# Patient Record
Sex: Female | Born: 1970
Health system: Southern US, Community
[De-identification: ages and names within clinical notes are randomized; demographics above are authoritative.]

## PROBLEM LIST (undated history)

## (undated) DIAGNOSIS — R87619 Unspecified abnormal cytological findings in specimens from cervix uteri: Secondary | ICD-10-CM

## (undated) DIAGNOSIS — I10 Essential (primary) hypertension: Secondary | ICD-10-CM

## (undated) DIAGNOSIS — Z973 Presence of spectacles and contact lenses: Secondary | ICD-10-CM

## (undated) DIAGNOSIS — D649 Anemia, unspecified: Secondary | ICD-10-CM

## (undated) DIAGNOSIS — Z8719 Personal history of other diseases of the digestive system: Secondary | ICD-10-CM

## (undated) DIAGNOSIS — Z8711 Personal history of peptic ulcer disease: Secondary | ICD-10-CM

## (undated) DIAGNOSIS — A64 Unspecified sexually transmitted disease: Secondary | ICD-10-CM

## (undated) DIAGNOSIS — E119 Type 2 diabetes mellitus without complications: Secondary | ICD-10-CM

## (undated) DIAGNOSIS — T7840XA Allergy, unspecified, initial encounter: Secondary | ICD-10-CM

## (undated) HISTORY — DX: Unspecified abnormal cytological findings in specimens from cervix uteri: R87.619

## (undated) HISTORY — DX: Presence of spectacles and contact lenses: Z97.3

## (undated) HISTORY — DX: Personal history of peptic ulcer disease: Z87.11

## (undated) HISTORY — DX: Allergy, unspecified, initial encounter: T78.40XA

## (undated) HISTORY — DX: Unspecified sexually transmitted disease: A64

## (undated) HISTORY — DX: Type 2 diabetes mellitus without complications: E11.9

## (undated) HISTORY — DX: Anemia, unspecified: D64.9

## (undated) HISTORY — DX: Essential (primary) hypertension: I10

## (undated) HISTORY — PX: CRYOTHERAPY: SHX1416

## (undated) HISTORY — DX: Personal history of other diseases of the digestive system: Z87.19

---

## 1994-07-22 DIAGNOSIS — A64 Unspecified sexually transmitted disease: Secondary | ICD-10-CM

## 1994-07-22 HISTORY — DX: Unspecified sexually transmitted disease: A64

## 1994-07-22 HISTORY — PX: GYNECOLOGIC CRYOSURGERY: SHX857

## 1999-07-06 ENCOUNTER — Other Ambulatory Visit: Admission: RE | Admit: 1999-07-06 | Discharge: 1999-07-06 | Payer: Self-pay | Admitting: Obstetrics and Gynecology

## 1999-12-04 ENCOUNTER — Encounter: Admission: RE | Admit: 1999-12-04 | Discharge: 2000-03-03 | Payer: Self-pay | Admitting: Endocrinology

## 2001-05-14 ENCOUNTER — Ambulatory Visit (HOSPITAL_COMMUNITY): Admission: RE | Admit: 2001-05-14 | Discharge: 2001-05-14 | Payer: Self-pay | Admitting: Obstetrics and Gynecology

## 2001-05-14 ENCOUNTER — Encounter (INDEPENDENT_AMBULATORY_CARE_PROVIDER_SITE_OTHER): Payer: Self-pay

## 2001-09-21 ENCOUNTER — Other Ambulatory Visit: Admission: RE | Admit: 2001-09-21 | Discharge: 2001-09-21 | Payer: Self-pay | Admitting: Obstetrics and Gynecology

## 2002-12-08 ENCOUNTER — Other Ambulatory Visit: Admission: RE | Admit: 2002-12-08 | Discharge: 2002-12-08 | Payer: Self-pay | Admitting: Obstetrics and Gynecology

## 2003-12-30 ENCOUNTER — Other Ambulatory Visit: Admission: RE | Admit: 2003-12-30 | Discharge: 2003-12-30 | Payer: Self-pay | Admitting: Obstetrics and Gynecology

## 2005-11-26 ENCOUNTER — Other Ambulatory Visit: Admission: RE | Admit: 2005-11-26 | Discharge: 2005-11-26 | Payer: Self-pay | Admitting: Obstetrics and Gynecology

## 2007-02-04 ENCOUNTER — Other Ambulatory Visit: Admission: RE | Admit: 2007-02-04 | Discharge: 2007-02-04 | Payer: Self-pay | Admitting: Obstetrics and Gynecology

## 2008-03-23 ENCOUNTER — Other Ambulatory Visit: Admission: RE | Admit: 2008-03-23 | Discharge: 2008-03-23 | Payer: Self-pay | Admitting: Obstetrics and Gynecology

## 2010-12-07 NOTE — Op Note (Signed)
[facility]  Patient:    Tammie Sims, Tammie Sims Visit Number: [id number] MRN: [id number]          Service Type: DSU Location: [facility] Attending Physician:  Miguel Aschoff Dictated by:   Miguel Aschoff, M.D. Proc. Date: 05/14/01 Admit Date:  05/14/2001 Discharge Date: 05/14/2001                             Operative Report  PREOPERATIVE DIAGNOSIS:         Missed abortion.  POSTOPERATIVE DIAGNOSIS:        Missed abortion.  OPERATION:                      Suction curettage.  SURGEON:                        Miguel Aschoff, M.D.  ANESTHESIA:                     IV sedation with paracervical block.  COMPLICATIONS:                  None.  INDICATIONS:                    The patient is a  40 year old black female gravida 3, para 1-0-1-1 last menstrual period of April 04, 2001.  The patient was receiving routine prenatal care when an ultrasound was carried out and revealed the absence of a fetal heart beat and a fetal pole.  This was confirmed by secondary ultrasound.  In view of the diagnosis now of missed abortion, the patient is being taken to the operating room to undergo evacuation of the uterus via dilatation and evacuation.  The risks and benefits of this procedure have been discussed with the patient.  DESCRIPTION OF PROCEDURE:       The patient was taken to the operating room and placed in the supine position.  Intravenous sedation was administered without difficulty.  She was then placed in the dorsal lithotomy position, prepped and draped in the usual sterile fashion.  Examination revealed the uterus to be anterior with normal, anterior, approximately 8-10 weeks in size. The adnexa revealed no masses.  The speculum was placed in the vaginal vault. The anterior cervical lip was grasped with a tenaculum and then injected with 18 cc of 1% Xylocaine by placing 6 cc at the 6, 4 and 8 oclock positions.  As this was done, serial Pratt dilators were used to  dilate the endocervical canal until a #29 Pratt dilator could be passed.  Then using a #9 vacuum curette the contents of the uterus were systemically evacuated.  After this was done, sharp curettage was carried out yielding only a small amount of additional tissue.  Final pass was made with the vacuum curet and at this point, the procedure was completed with no further products of conception were returned.  All instruments were removed.  Excellent hemostasis was achieved, and the procedure was completed.  The patient was reversed from the anesthetic and taken to the recovery room in satisfactory condition.  Estimated blood loss was approximately 80 cc.  PLAN:                           The plan is for the patient to be discharged home.  Medications for home include doxycycline  100 mg twice a day x 3 days and Darvocet-N 100 mg one every 4-6 hours as needed for pain.  She is to call if there are any problems such as fever, pain or heavy bleeding.  She will be seen back in the office in four weeks for follow-up examination.  The patients blood type is noted to be Rh positive. Dictated by:   Miguel Aschoff, M.D. Attending Physician:  Miguel Aschoff DD:  05/14/01 TD:  05/16/01 Job: [id number]

## 2013-01-29 ENCOUNTER — Encounter: Payer: Self-pay | Admitting: Obstetrics and Gynecology

## 2013-02-15 ENCOUNTER — Encounter: Payer: Self-pay | Admitting: Obstetrics and Gynecology

## 2013-02-16 ENCOUNTER — Ambulatory Visit: Payer: Self-pay | Admitting: Obstetrics and Gynecology

## 2013-02-16 ENCOUNTER — Encounter: Payer: Self-pay | Admitting: Obstetrics and Gynecology

## 2013-02-23 ENCOUNTER — Ambulatory Visit: Payer: Self-pay | Admitting: Obstetrics and Gynecology

## 2013-02-23 ENCOUNTER — Telehealth: Payer: Self-pay | Admitting: Obstetrics and Gynecology

## 2013-02-23 NOTE — Telephone Encounter (Signed)
FYI only--patient cancelled aex for today with Dr. Tresa Res due to "uncomfortable being seen on cycle." RS with Dr. Tresa Res for 03/03/13.

## 2013-03-03 ENCOUNTER — Encounter: Payer: Self-pay | Admitting: Obstetrics and Gynecology

## 2013-03-03 ENCOUNTER — Ambulatory Visit (INDEPENDENT_AMBULATORY_CARE_PROVIDER_SITE_OTHER): Payer: 59 | Admitting: Obstetrics and Gynecology

## 2013-03-03 VITALS — BP 124/70 | HR 80 | Ht 67.0 in | Wt 288.5 lb

## 2013-03-03 DIAGNOSIS — N899 Noninflammatory disorder of vagina, unspecified: Secondary | ICD-10-CM

## 2013-03-03 DIAGNOSIS — Z0000 Encounter for general adult medical examination without abnormal findings: Secondary | ICD-10-CM

## 2013-03-03 DIAGNOSIS — Z01419 Encounter for gynecological examination (general) (routine) without abnormal findings: Secondary | ICD-10-CM

## 2013-03-03 LAB — POCT URINALYSIS DIPSTICK
Blood, UA: NEGATIVE
Glucose, UA: NEGATIVE
Nitrite, UA: NEGATIVE
Urobilinogen, UA: NEGATIVE
pH, UA: 5

## 2013-03-03 MED ORDER — NORETHINDRONE 0.35 MG PO TABS: 1.0000 | ORAL_TABLET | Freq: Every day | ORAL | Status: DC

## 2013-03-03 NOTE — Progress Notes (Addendum)
Patient ID: Tammie Sims, female   DOB: Feb 05, 1971, 42 y.o.   MRN: [id number] 42 y.o.   Married    [place]   female   [phone]   here for annual exam.  Husband decided he would NOT do a vasectomy, so she is still on micronor.  Menses somewhat irregular, moderate flow.   Blood sugars are mildly elevated; her PCP is just watching them for now and she is not on any medication for diabetes.  Pt states she was treated in April for a bladder infection with an antibiotic, and she has had some vaginal itching and wonders if she has a yeast infection.   Patient's last menstrual period was 02/14/2013.          Sexually active: yes  The current method of family planning is OCP (estrogen/progesterone).    Exercising: no Last mammogram:  never Last pap smear:02-08-12 wnl:no HR HPV done History of abnormal pap: 20 years ago had colposcopy and cryotherapy to cervix.  Pap smears normal since. Smoking: no Alcohol: no Last colonoscopy: never Last Bone Density:  never Last tetanus shot: 08/2012 Last cholesterol check: 06/2012 wnl  Hgb:                Urine: Neg   Family History  Problem Relation Age of Onset  . Hypertension Mother   . Diabetes Maternal Grandmother   . Hypertension Maternal Grandmother   . Breast cancer Maternal Grandmother   . Diabetes Maternal Grandfather   . Hypertension Maternal Grandfather     There are no active problems to display for this patient.   Past Medical History  Diagnosis Date  . Hypertension   . History of stomach ulcers     age 21 or 75  . Diabetes mellitus without complication     controlled by diet  . STD (sexually transmitted disease) 1996    Hx HSV  . Anemia     Past Surgical History  Procedure Laterality Date  . Cesarean section  1997  . Gynecologic cryosurgery  1996    abnormal pap    Allergies: Review of patient's allergies indicates no known allergies.  Current Outpatient Prescriptions  Medication Sig Dispense Refill  . calcium  carbonate (OS-CAL) 600 MG TABS tablet Take 600 mg by mouth 2 (two) times daily with a meal.      . Cetirizine HCl (ZYRTEC PO) Take by mouth daily.      . Multiple Vitamin (MULTI-VITAMIN DAILY PO) Take by mouth daily.      . norethindrone (MICRONOR,CAMILA,ERRIN) 0.35 MG tablet Take 1 tablet by mouth daily.      Marland Kitchen olmesartan-hydrochlorothiazide (BENICAR HCT) 20-12.5 MG per tablet Take 1 tablet by mouth daily.       No current facility-administered medications for this visit.    ROS: Pertinent items are noted in HPI.  Social Hx:  Married, one son born 1997, works in Clinical biochemist  Exam:    BP 124/70  Pulse 80  Ht 5\' 7"  (1.702 m)  Wt 288 lb 8 oz (130.863 kg)  BMI 45.17 kg/m2  LMP 07/27/2014Ht stable and wt up 4 pounds from last year   Wt Readings from Last 3 Encounters:  03/03/13 288 lb 8 oz (130.863 kg)     Ht Readings from Last 3 Encounters:  03/03/13 5\' 7"  (1.702 m)    General appearance: alert, cooperative and appears stated age Head: Normocephalic, without obvious abnormality, atraumatic Neck: no adenopathy, supple, symmetrical, trachea midline and thyroid  not enlarged, symmetric, no tenderness/mass/nodules Lungs: clear to auscultation bilaterally Breasts: Inspection negative, No nipple retraction or dimpling, No nipple discharge or bleeding, No axillary or supraclavicular adenopathy, Normal to palpation without dominant masses Heart: regular rate and rhythm Abdomen: soft, non-tender; bowel sounds normal; no masses,  no organomegaly Extremities: extremities normal, atraumatic, no cyanosis or edema Skin: Skin color, texture, turgor normal. No rashes or lesions Lymph nodes: Cervical, supraclavicular, and axillary nodes normal. No abnormal inguinal nodes palpated Neurologic: Grossly normal   Pelvic: External genitalia:  no lesions              Urethra:  normal appearing urethra with no masses, tenderness or lesions              Bartholins and Skenes: normal                  Vagina: normal appearing vagina with normal color and discharge, no lesions              Cervix: normal appearance              Pap taken: no        Bimanual Exam:  Uterus:  uterus is normal size, shape, consistency and nontender                                      Adnexa: normal adnexa in size, nontender and no masses                                      Rectovaginal: Confirms                                      Anus:  normal sphincter tone, no lesions  A: normal gyn exam, micronor     HTN, obesity, AODM     HSV vulva dx'd 1996     P:     mammogram counseled on breast self exam, mammography screening, adequate intake of calcium and vitamin D, diet and exercise return annually or prn rf micronor for 1 year   Wet prep neg for yeast, bv, or trich.  Pt reassured.   An After Visit Summary was printed and given to the patient.

## 2013-03-03 NOTE — Patient Instructions (Signed)
EXERCISE AND DIET:  We recommended that you start or continue a regular exercise program for good health. Regular exercise means any activity that makes your heart beat faster and makes you sweat.  We recommend exercising at least 30 minutes per day at least 3 days a week, preferably 4 or 5.  We also recommend a diet low in fat and sugar.  Inactivity, poor dietary choices and obesity can cause diabetes, heart attack, stroke, and kidney damage, among others.    ALCOHOL AND SMOKING:  Women should limit their alcohol intake to no more than 7 drinks/beers/glasses of wine (combined, not each!) per week. Moderation of alcohol intake to this level decreases your risk of breast cancer and liver damage. And of course, no recreational drugs are part of a healthy lifestyle.  And absolutely no smoking or even second hand smoke. Most people know smoking can cause heart and lung diseases, but did you know it also contributes to weakening of your bones? Aging of your skin?  Yellowing of your teeth and nails?  CALCIUM AND VITAMIN D:  Adequate intake of calcium and Vitamin D are recommended.  The recommendations for exact amounts of these supplements seem to change often, but generally speaking 600 mg of calcium (either carbonate or citrate) and 800 units of Vitamin D per day seems prudent. Certain women may benefit from higher intake of Vitamin D.  If you are among these women, your doctor will have told you during your visit.    PAP SMEARS:  Pap smears, to check for cervical cancer or precancers,  have traditionally been done yearly, although recent scientific advances have shown that most women can have pap smears less often.  However, every woman still should have a physical exam from her gynecologist every year. It will include a breast check, inspection of the vulva and vagina to check for abnormal growths or skin changes, a visual exam of the cervix, and then an exam to evaluate the size and shape of the uterus and  ovaries.  And after 40 years of age, a rectal exam is indicated to check for rectal cancers. We will also provide age appropriate advice regarding health maintenance, like when you should have certain vaccines, screening for sexually transmitted diseases, bone density testing, colonoscopy, mammograms, etc.   MAMMOGRAMS:  All women over 40 years old should have a yearly mammogram. Many facilities now offer a "3D" mammogram, which may cost around $50 extra out of pocket. If possible,  we recommend you accept the option to have the 3D mammogram performed.  It both reduces the number of women who will be called back for extra views which then turn out to be normal, and it is better than the routine mammogram at detecting truly abnormal areas.       

## 2013-03-03 NOTE — Addendum Note (Signed)
Addended by: Alison Murray on: 03/03/2013 04:47 PM   Modules accepted: Level of Service

## 2013-03-10 DIAGNOSIS — L249 Irritant contact dermatitis, unspecified cause: Secondary | ICD-10-CM | POA: Insufficient documentation

## 2013-03-10 DIAGNOSIS — L709 Acne, unspecified: Secondary | ICD-10-CM | POA: Insufficient documentation

## 2013-05-28 ENCOUNTER — Other Ambulatory Visit: Payer: Self-pay

## 2013-05-28 DIAGNOSIS — Z1231 Encounter for screening mammogram for malignant neoplasm of breast: Secondary | ICD-10-CM

## 2013-06-02 ENCOUNTER — Encounter: Payer: Self-pay | Admitting: Nurse Practitioner

## 2013-06-29 ENCOUNTER — Ambulatory Visit
Admission: RE | Admit: 2013-06-29 | Discharge: 2013-06-29 | Disposition: A | Discharge status: Home / Self Care | Payer: 59 | Source: Ambulatory Visit

## 2013-06-29 ENCOUNTER — Other Ambulatory Visit: Payer: Self-pay

## 2013-06-29 DIAGNOSIS — Z1231 Encounter for screening mammogram for malignant neoplasm of breast: Secondary | ICD-10-CM

## 2013-07-01 ENCOUNTER — Other Ambulatory Visit: Payer: Self-pay | Admitting: Obstetrics and Gynecology

## 2013-07-01 DIAGNOSIS — R928 Other abnormal and inconclusive findings on diagnostic imaging of breast: Secondary | ICD-10-CM

## 2013-07-07 ENCOUNTER — Ambulatory Visit
Admission: RE | Admit: 2013-07-07 | Discharge: 2013-07-07 | Disposition: A | Discharge status: Home / Self Care | Payer: 59 | Source: Ambulatory Visit | Attending: Obstetrics and Gynecology | Admitting: Obstetrics and Gynecology

## 2013-07-07 DIAGNOSIS — R928 Other abnormal and inconclusive findings on diagnostic imaging of breast: Secondary | ICD-10-CM

## 2014-03-04 ENCOUNTER — Ambulatory Visit: Payer: 59 | Admitting: Nurse Practitioner

## 2014-03-14 ENCOUNTER — Encounter: Payer: Self-pay | Admitting: Nurse Practitioner

## 2014-03-14 ENCOUNTER — Ambulatory Visit: Payer: 59 | Admitting: Nurse Practitioner

## 2014-03-14 ENCOUNTER — Ambulatory Visit (INDEPENDENT_AMBULATORY_CARE_PROVIDER_SITE_OTHER): Payer: 59 | Admitting: Nurse Practitioner

## 2014-03-14 VITALS — BP 120/64 | HR 80 | Ht 66.25 in | Wt 287.0 lb

## 2014-03-14 DIAGNOSIS — Z0000 Encounter for general adult medical examination without abnormal findings: Secondary | ICD-10-CM

## 2014-03-14 DIAGNOSIS — Z01419 Encounter for gynecological examination (general) (routine) without abnormal findings: Secondary | ICD-10-CM

## 2014-03-14 MED ORDER — NORETHINDRONE 0.35 MG PO TABS: 1.0000 | ORAL_TABLET | Freq: Every day | ORAL | Status: DC

## 2014-03-14 NOTE — Progress Notes (Signed)
Patient ID: Tammie Sims, female   DOB: 07-04-1971, 43 y.o.   MRN: [id number] 43 y.o. [id number] Married African American Fe here for annual exam.  Menses for 4-5 days.  Heavier for 2 days.  Some cramps releif with OTC NSAID'S.  Husband did not get a vasecetomy.  She has been having problems with BP regulation and thought that the POP pill would cause the elevation of BP so she stopped the pill.  Now off and still had trouble but now regulated.  Patient's last menstrual period was 03/02/2014.          Sexually active: Yes.    The current method of family planning is coitus interruptus. Exercising: No.  The patient does not participate in regular exercise at present. Smoker:  no  Health Maintenance: Pap:  02/08/11, WNL, no HPV done MMG:  06/29/13, extra view and ultrasound, Bi-Rads 1: negative  TDaP:  08/2012 Labs: Labs by PCP.  Last checked in 01/2014.    reports that she has never smoked. She does not have any smokeless tobacco history on file. She reports that she does not drink alcohol or use illicit drugs.  Past Medical History  Diagnosis Date  . Hypertension   . History of stomach ulcers     age 45 or 42  . Diabetes mellitus without complication     controlled by diet  . STD (sexually transmitted disease) 1996    Hx HSV  . Anemia     Past Surgical History  Procedure Laterality Date  . Cesarean section  1997  . Gynecologic cryosurgery  1996    abnormal pap    Current Outpatient Prescriptions  Medication Sig Dispense Refill  . calcium carbonate (OS-CAL) 600 MG TABS tablet Take 600 mg by mouth 2 (two) times daily with a meal.      . Cetirizine HCl (ZYRTEC PO) Take by mouth daily.      . Multiple Vitamin (MULTI-VITAMIN DAILY PO) Take by mouth daily.      . norethindrone (MICRONOR,CAMILA,ERRIN) 0.35 MG tablet Take 1 tablet (0.35 mg total) by mouth daily.  3 Package  3  . olmesartan-hydrochlorothiazide (BENICAR HCT) 40-12.5 MG per tablet Take 1 tablet by mouth daily.       No  current facility-administered medications for this visit.    Family History  Problem Relation Age of Onset  . Hypertension Mother   . Diabetes Maternal Grandmother   . Hypertension Maternal Grandmother   . Breast cancer Maternal Grandmother   . Diabetes Maternal Grandfather   . Hypertension Maternal Grandfather     ROS:  Pertinent items are noted in HPI.  Otherwise, a comprehensive ROS was negative.  Exam:   BP 120/64  Pulse 80  Ht 5' 6.25" (1.683 m)  Wt 287 lb (130.182 kg)  BMI 45.96 kg/m2  LMP 03/02/2014 Height: 5' 6.25" (168.3 cm)  Ht Readings from Last 3 Encounters:  03/14/14 5' 6.25" (1.683 m)  03/03/13  (1.702 m)    General appearance: alert, cooperative and appears stated age Head: Normocephalic, without obvious abnormality, atraumatic Neck: no adenopathy, supple, symmetrical, trachea midline and thyroid normal to inspection and palpation Lungs: clear to auscultation bilaterally Breasts: normal appearance, no masses or tenderness Heart: regular rate and rhythm Abdomen: soft, non-tender; no masses,  no organomegaly Extremities: extremities normal, atraumatic, no cyanosis or edema Skin: Skin color, texture, turgor normal. No rashes or lesions Lymph nodes: Cervical, supraclavicular, and axillary nodes normal. No abnormal inguinal nodes palpated Neurologic: Grossly  normal   Pelvic: External genitalia:  no lesions              Urethra:  normal appearing urethra with no masses, tenderness or lesions              Bartholin's and Skene's: normal                 Vagina: normal appearing vagina with normal color and discharge, no lesions              Cervix: anteverted              Pap taken: Yes.   Bimanual Exam:  Uterus:  normal size, contour, position, consistency, mobility, non-tender              Adnexa: no mass, fullness, tenderness               Rectovaginal: Confirms               Anus:  normal sphincter tone, no lesions  A:  Well Woman with normal  exam  Withdrawal for contraception  Now restarts POP  History of HTN, borderline DM  P:   Reviewed health and wellness pertinent to exam  Pap smear taken today  Mammogram is due 12/15  Restarted on POP X 1 year - discussed side effects, BUM, and start date.  Counseled on breast self exam, mammography screening, use and side effects of POP's, adequate intake of calcium and vitamin D, diet and exercise return annually or prn  An After Visit Summary was printed and given to the patient.

## 2014-03-14 NOTE — Patient Instructions (Signed)

## 2014-03-17 LAB — IPS PAP TEST WITH HPV

## 2014-03-20 NOTE — Progress Notes (Signed)
Encounter reviewed by Dr. Jasmon Mattice Silva.  

## 2014-05-23 ENCOUNTER — Encounter: Payer: Self-pay | Admitting: Nurse Practitioner

## 2014-05-31 ENCOUNTER — Other Ambulatory Visit: Payer: Self-pay

## 2014-05-31 DIAGNOSIS — Z1231 Encounter for screening mammogram for malignant neoplasm of breast: Secondary | ICD-10-CM

## 2014-06-30 ENCOUNTER — Ambulatory Visit
Admission: RE | Admit: 2014-06-30 | Discharge: 2014-06-30 | Disposition: A | Discharge status: Home / Self Care | Payer: 59 | Source: Ambulatory Visit

## 2014-06-30 DIAGNOSIS — Z1231 Encounter for screening mammogram for malignant neoplasm of breast: Secondary | ICD-10-CM

## 2015-03-16 ENCOUNTER — Ambulatory Visit: Payer: 59 | Admitting: Nurse Practitioner

## 2015-03-17 ENCOUNTER — Ambulatory Visit: Payer: Managed Care, Other (non HMO) | Admitting: Nurse Practitioner

## 2015-05-08 ENCOUNTER — Encounter: Payer: Self-pay | Admitting: Nurse Practitioner

## 2015-05-08 ENCOUNTER — Ambulatory Visit (INDEPENDENT_AMBULATORY_CARE_PROVIDER_SITE_OTHER): Payer: 59 | Admitting: Nurse Practitioner

## 2015-05-08 VITALS — BP 140/60 | HR 64 | Resp 16 | Ht 66.25 in | Wt 284.0 lb

## 2015-05-08 DIAGNOSIS — Z0000 Encounter for general adult medical examination without abnormal findings: Secondary | ICD-10-CM

## 2015-05-08 DIAGNOSIS — Z01419 Encounter for gynecological examination (general) (routine) without abnormal findings: Secondary | ICD-10-CM | POA: Diagnosis not present

## 2015-05-08 NOTE — Patient Instructions (Signed)

## 2015-05-08 NOTE — Progress Notes (Signed)
44 y.o. [id number] Married  African American Fe here for annual exam. Menses regular 21 days.  Flow for 5 days.  2 days heavier flow with PMS. She has not been sleeping well and very stressed over work issues.   Patient's last menstrual period was 04/29/2015.          Sexually active: Yes.    The current method of family planning is none, oc withdrawal  Exercising: No.  not regularly Smoker:  no  Health Maintenance: Pap:  03/14/14 WNL/negative HR HPV MMG:  06/30/14 3D-BiRads 1-negative TDaP:  2014 Labs: PCP   reports that she has never smoked. She has never used smokeless tobacco. She reports that she does not drink alcohol or use illicit drugs.  Past Medical History  Diagnosis Date  . Hypertension   . History of stomach ulcers     age [age] or [age]  . Diabetes mellitus without complication (HCC)     controlled by diet  . STD (sexually transmitted disease) 1996    Hx HSV  . Anemia     Past Surgical History  Procedure Laterality Date  . Cesarean section  1997  . Gynecologic cryosurgery  1996    abnormal pap    Current Outpatient Prescriptions  Medication Sig Dispense Refill  . BENICAR HCT 40-25 MG tablet     . Cetirizine HCl (ZYRTEC PO) Take by mouth daily.    . Multiple Vitamin (MULTI-VITAMIN DAILY PO) Take by mouth daily.    Marland Kitchen. omeprazole (PRILOSEC) 20 MG capsule     . Vitamin D, Ergocalciferol, (DRISDOL) 50000 UNITS CAPS capsule Once weekly     No current facility-administered medications for this visit.    Family History  Problem Relation Age of Onset  . Diabetes Maternal Grandmother   . Hypertension Maternal Grandmother   . Breast cancer Maternal Grandmother   . Diabetes Maternal Grandfather   . Hypertension Maternal Grandfather   . Hypertension Father     ROS:  Pertinent items are noted in HPI.  Otherwise, a comprehensive ROS was negative.  Exam:   BP 140/60 mmHg  Pulse 64  Resp 16  Ht 5' 6.25" (1.683 m)  Wt 284 lb (128.822 kg)  BMI 45.48 kg/m2  LMP  04/29/2015 Height: 5' 6.25" (168.3 cm) Ht Readings from Last 3 Encounters:  05/08/15 5' 6.25" (1.683 m)  03/14/14 5' 6.25" (1.683 m)  03/03/13 5\' 7"  (1.702 m)    General appearance: alert, cooperative and appears stated age Head: Normocephalic, without obvious abnormality, atraumatic Neck: no adenopathy, supple, symmetrical, trachea midline and thyroid normal to inspection and palpation Lungs: clear to auscultation bilaterally Breasts: normal appearance, no masses or tenderness Heart: regular rate and rhythm Abdomen: soft, non-tender; no masses,  no organomegaly Extremities: extremities normal, atraumatic, no cyanosis or edema Skin: Skin color, texture, turgor normal. No rashes or lesions Lymph nodes: Cervical, supraclavicular, and axillary nodes normal. No abnormal inguinal nodes palpated Neurologic: Grossly normal   Pelvic: External genitalia:  no lesions              Urethra:  normal appearing urethra with no masses, tenderness or lesions              Bartholin's and Skene's: normal                 Vagina: normal appearing vagina with normal color and discharge, no lesions              Cervix: anteverted  Pap taken: No. Bimanual Exam:  Uterus:  normal size, contour, position, consistency, mobility, non-tender              Adnexa: no mass, fullness, tenderness               Rectovaginal: Confirms               Anus:  normal sphincter tone, no lesions  Chaperone present: no  A:  Well Woman with normal exam  Withdrawal for contraception Off POP for many months History of HTN, borderline DM  Situational stress  P:   Reviewed health and wellness pertinent to exam  Pap smear as above  Mammogram is due 06/2015  Information on Mirena IUD  Counseled on breast self exam, mammography screening, adequate intake of calcium and vitamin D, diet and exercise return annually or prn  An After Visit Summary was printed and given to the patient.

## 2015-05-09 NOTE — Progress Notes (Signed)
Encounter reviewed by Dr. Shaquil Aldana Amundson C. Silva.  

## 2015-08-16 ENCOUNTER — Other Ambulatory Visit: Payer: Self-pay

## 2015-08-16 DIAGNOSIS — Z1231 Encounter for screening mammogram for malignant neoplasm of breast: Secondary | ICD-10-CM

## 2015-08-31 ENCOUNTER — Ambulatory Visit
Admission: RE | Admit: 2015-08-31 | Discharge: 2015-08-31 | Disposition: A | Discharge status: Home / Self Care | Payer: 59 | Source: Ambulatory Visit

## 2015-08-31 ENCOUNTER — Other Ambulatory Visit: Payer: Self-pay

## 2015-08-31 DIAGNOSIS — Z1231 Encounter for screening mammogram for malignant neoplasm of breast: Secondary | ICD-10-CM

## 2015-09-01 ENCOUNTER — Other Ambulatory Visit: Payer: Self-pay | Admitting: Nurse Practitioner

## 2015-09-01 ENCOUNTER — Other Ambulatory Visit: Payer: Self-pay

## 2015-09-01 DIAGNOSIS — N63 Unspecified lump in unspecified breast: Secondary | ICD-10-CM

## 2015-09-05 ENCOUNTER — Ambulatory Visit
Admission: RE | Admit: 2015-09-05 | Discharge: 2015-09-05 | Disposition: A | Discharge status: Home / Self Care | Payer: 59 | Source: Ambulatory Visit | Attending: Nurse Practitioner | Admitting: Nurse Practitioner

## 2015-09-05 ENCOUNTER — Other Ambulatory Visit: Payer: Self-pay

## 2015-09-05 DIAGNOSIS — N63 Unspecified lump in unspecified breast: Secondary | ICD-10-CM

## 2016-05-10 ENCOUNTER — Ambulatory Visit (INDEPENDENT_AMBULATORY_CARE_PROVIDER_SITE_OTHER): Payer: 59 | Admitting: Nurse Practitioner

## 2016-05-10 ENCOUNTER — Encounter: Payer: Self-pay | Admitting: Nurse Practitioner

## 2016-05-10 VITALS — BP 118/74 | HR 72 | Ht 66.25 in | Wt 288.0 lb

## 2016-05-10 DIAGNOSIS — N920 Excessive and frequent menstruation with regular cycle: Secondary | ICD-10-CM

## 2016-05-10 DIAGNOSIS — Z01419 Encounter for gynecological examination (general) (routine) without abnormal findings: Secondary | ICD-10-CM | POA: Diagnosis not present

## 2016-05-10 DIAGNOSIS — Z0000 Encounter for general adult medical examination without abnormal findings: Secondary | ICD-10-CM

## 2016-05-10 MED ORDER — NORETHINDRONE 0.35 MG PO TABS: 1.0000 | ORAL_TABLET | Freq: Every day | ORAL | 4 refills | Status: DC

## 2016-05-10 NOTE — Progress Notes (Signed)
Patient ID: Tammie HeightKatrina C Sims, female   DOB: 12/01/1970, 45 y.o.   MRN: [id number]  45 y.o. [id number] Married  [place] Fe here for annual exam.  No new health problems.  Menses still regular at 21 days.  Flow for 5 days, still 2 heavy days then light.  No cramps.  Now back on RX Vit D for very low Vit D at PCP.  Patient's last menstrual period was 04/29/2016 (exact date).          Sexually active: Yes.    The current method of family planning is none.    Exercising: No.  The patient does not participate in regular exercise at present. Smoker:  no  Health Maintenance: Pap:  03/14/14, Negative with neg HR HPV MMG:09/05/15 Bilateral Diagnostic with Left Breast Ultrasound; Bi-Rads 2: Benign Findings, routine screen in one year TDaP: 08/2012 HIV: 1997 with pregnancy Labs: PCP takes care of all labs   reports that she has never smoked. She has never used smokeless tobacco. She reports that she does not drink alcohol or use drugs.  Past Medical History:  Diagnosis Date  . Anemia   . Diabetes mellitus without complication (HCC)    controlled by diet  . History of stomach ulcers    age 45 or [age]  . Hypertension   . STD (sexually transmitted disease) 1996   Hx HSV    Past Surgical History:  Procedure Laterality Date  . CESAREAN SECTION  1997  . GYNECOLOGIC CRYOSURGERY  1996   abnormal pap    Current Outpatient Prescriptions  Medication Sig Dispense Refill  . BENICAR HCT 40-25 MG tablet     . Cetirizine HCl (ZYRTEC PO) Take by mouth daily.    . Multiple Vitamin (MULTI-VITAMIN DAILY PO) Take by mouth daily.    Marland Kitchen. omeprazole (PRILOSEC) 20 MG capsule     . Vitamin D, Ergocalciferol, (DRISDOL) 50000 UNITS CAPS capsule Once weekly     No current facility-administered medications for this visit.     Family History  Problem Relation Age of Onset  . Hypertension Father   . Diabetes Maternal Grandmother   . Hypertension Maternal Grandmother   . Breast cancer Maternal Grandmother    . Diabetes Maternal Grandfather   . Hypertension Maternal Grandfather     ROS:  Pertinent items are noted in HPI.  Otherwise, a comprehensive ROS was negative.  Exam:   BP 118/74 (BP Location: Right Arm, Patient Position: Sitting, Cuff Size: Large)   Pulse 72   Ht 5' 6.25" (1.683 m)   Wt 288 lb (130.6 kg)   LMP 04/29/2016 (Exact Date)   BMI 46.13 kg/m  Sims: 5' 6.25" (168.3 cm) Ht Readings from Last 3 Encounters:  05/10/16 5' 6.25" (1.683 m)  05/08/15 5' 6.25" (1.683 m)  03/14/14 5' 6.25" (1.683 m)    General appearance: alert, cooperative and appears stated age Head: Normocephalic, without obvious abnormality, atraumatic Neck: no adenopathy, supple, symmetrical, trachea midline and thyroid normal to inspection and palpation Lungs: clear to auscultation bilaterally Breasts: normal appearance, no masses or tenderness Heart: regular rate and rhythm Abdomen: soft, non-tender; no masses,  no organomegaly Extremities: extremities normal, atraumatic, no cyanosis or edema Skin: Skin color, texture, turgor normal. No rashes or lesions Lymph nodes: Cervical, supraclavicular, and axillary nodes normal. No abnormal inguinal nodes palpated Neurologic: Grossly normal   Pelvic: External genitalia:  no lesions              Urethra:  normal appearing urethra  with no masses, tenderness or lesions              Bartholin's and Skene's: normal                 Vagina: normal appearing vagina with normal color and discharge, no lesions              Cervix: anteverted              Pap taken: No. Bimanual Exam:  Uterus:  normal size, contour, position, consistency, mobility, non-tender              Adnexa: no mass, fullness, tenderness               Rectovaginal: Confirms               Anus:  normal sphincter tone, no lesions  Chaperone present: yes  A:  Well Woman with normal exam  Withdrawal for contraception Off POP for many months but now willing to go back on to help  with contraception History of HTN, borderline DM             Situational stress  History of menorrhagia  P:   Reviewed health and wellness pertinent to exam  Pap smear not done  Mammogram is due 2/18  Restart POP for a year - to call back if any problems  She is advised to use BUM and to be compliant to POP  Counseled on breast self exam, mammography screening, adequate intake of calcium and vitamin D, diet and exercise return annually or prn  An After Visit Summary was printed and given to the patient.

## 2016-05-10 NOTE — Patient Instructions (Signed)

## 2016-05-12 NOTE — Progress Notes (Signed)
Encounter reviewed by Dr. Davari Lopes Amundson C. Silva.  

## 2016-08-14 DIAGNOSIS — J3089 Other allergic rhinitis: Secondary | ICD-10-CM | POA: Diagnosis not present

## 2016-08-14 DIAGNOSIS — J301 Allergic rhinitis due to pollen: Secondary | ICD-10-CM | POA: Diagnosis not present

## 2016-08-14 DIAGNOSIS — K219 Gastro-esophageal reflux disease without esophagitis: Secondary | ICD-10-CM | POA: Diagnosis not present

## 2016-08-15 DIAGNOSIS — I10 Essential (primary) hypertension: Secondary | ICD-10-CM | POA: Diagnosis not present

## 2016-08-15 DIAGNOSIS — R7303 Prediabetes: Secondary | ICD-10-CM | POA: Diagnosis not present

## 2016-08-15 DIAGNOSIS — E559 Vitamin D deficiency, unspecified: Secondary | ICD-10-CM | POA: Diagnosis not present

## 2016-08-30 DIAGNOSIS — J329 Chronic sinusitis, unspecified: Secondary | ICD-10-CM | POA: Diagnosis not present

## 2016-08-30 DIAGNOSIS — J309 Allergic rhinitis, unspecified: Secondary | ICD-10-CM | POA: Diagnosis not present

## 2016-10-30 DIAGNOSIS — R7303 Prediabetes: Secondary | ICD-10-CM | POA: Diagnosis not present

## 2016-10-30 DIAGNOSIS — J309 Allergic rhinitis, unspecified: Secondary | ICD-10-CM | POA: Diagnosis not present

## 2016-10-30 DIAGNOSIS — Z0000 Encounter for general adult medical examination without abnormal findings: Secondary | ICD-10-CM | POA: Diagnosis not present

## 2016-10-30 DIAGNOSIS — I10 Essential (primary) hypertension: Secondary | ICD-10-CM | POA: Diagnosis not present

## 2017-02-10 ENCOUNTER — Telehealth: Payer: Self-pay | Admitting: Obstetrics and Gynecology

## 2017-02-10 NOTE — Telephone Encounter (Signed)
Left message regarding upcoming appointment has been canceled and needs to be rescheduled. °

## 2017-05-01 DIAGNOSIS — R7303 Prediabetes: Secondary | ICD-10-CM | POA: Diagnosis not present

## 2017-05-01 DIAGNOSIS — D509 Iron deficiency anemia, unspecified: Secondary | ICD-10-CM | POA: Diagnosis not present

## 2017-05-01 DIAGNOSIS — E7800 Pure hypercholesterolemia, unspecified: Secondary | ICD-10-CM | POA: Diagnosis not present

## 2017-05-01 DIAGNOSIS — I10 Essential (primary) hypertension: Secondary | ICD-10-CM | POA: Diagnosis not present

## 2017-05-01 DIAGNOSIS — J301 Allergic rhinitis due to pollen: Secondary | ICD-10-CM | POA: Diagnosis not present

## 2017-05-01 DIAGNOSIS — E559 Vitamin D deficiency, unspecified: Secondary | ICD-10-CM | POA: Diagnosis not present

## 2017-05-15 NOTE — Progress Notes (Signed)
46 y.o. [id number] Married  African American Fe here for annual exam. Periods normal, no issues. Sees PCP for aex, labs and Type 2 Diabetes management. BP has also come down since her last visit there. Working on her plan of action. She is feeling better about her health issues. Keeping up with dental and eye exams. Contraception withdrawal and ovulation patterns followed. No other health issues today.  Patient's last menstrual period was 04/02/2017.          Sexually active: Yes.    The current method of family planning is none.    Exercising: No.  exercise Smoker:  no  Health Maintenance: Pap:  03-14-14 neg HPV HR neg History of Abnormal Pap: yes in 20's MMG:  09-13-15 left breast u/scategory b density birads 2:neg plans to schedule Self Breast exams: no Colonoscopy:  none BMD:   none TDaP:  2014 Shingles: no Pneumonia: no Hep C and HIV: HIV done with pregnancy Labs: no   reports that she has never smoked. She has never used smokeless tobacco. She reports that she does not drink alcohol or use drugs.  Past Medical History:  Diagnosis Date  . Anemia   . Diabetes mellitus without complication (HCC)    controlled by diet  . History of stomach ulcers    age 46 or [age]  . Hypertension   . STD (sexually transmitted disease) 1996   Hx HSV    Past Surgical History:  Procedure Laterality Date  . CESAREAN SECTION  1997  . GYNECOLOGIC CRYOSURGERY  1996   abnormal pap    Current Outpatient Prescriptions  Medication Sig Dispense Refill  . BENICAR HCT 40-25 MG tablet     . Cetirizine HCl (ZYRTEC PO) Take by mouth daily.    . Cholecalciferol (VITAMIN D PO) Take by mouth.    . Coenzyme Q10 (CO Q 10 PO) Take by mouth.    . Omega-3 Fatty Acids (OMEGA 3 PO) Take by mouth.    Marland Kitchen. omeprazole (PRILOSEC) 20 MG capsule     . Probiotic Product (PROBIOTIC PO) Take by mouth.    . Vitamin D, Ergocalciferol, (DRISDOL) 50000 UNITS CAPS capsule Once weekly     No current facility-administered  medications for this visit.     Family History  Problem Relation Age of Onset  . Hypertension Father   . Diabetes Maternal Grandmother   . Hypertension Maternal Grandmother   . Breast cancer Maternal Grandmother [id number]       mastecomy with recurence to brain at 7672  . Diabetes Maternal Grandfather   . Hypertension Maternal Grandfather     ROS:  Pertinent items are noted in HPI.  Otherwise, a comprehensive ROS was negative.  Exam:   BP 120/70   Pulse 70   Resp 16   Ht 5' 6.25" (1.683 m)   Wt 296 lb (134.3 kg)   LMP 04/02/2017   BMI 47.42 kg/m  Height: 5' 6.25" (168.3 cm) Ht Readings from Last 3 Encounters:  05/16/17 5' 6.25" (1.683 m)  05/10/16 5' 6.25" (1.683 m)  05/08/15 5' 6.25" (1.683 m)    General appearance: alert, cooperative and appears stated age Head: Normocephalic, without obvious abnormality, atraumatic Neck: no adenopathy, supple, symmetrical, trachea midline and thyroid normal to inspection and palpation Lungs: clear to auscultation bilaterally Breasts: normal appearance, no masses or tenderness, No nipple retraction or dimpling, No nipple discharge or bleeding, No axillary or supraclavicular adenopathy, given SBE card reminder Heart: regular rate and rhythm Abdomen: soft, non-tender;  no masses,  no organomegaly Extremities: extremities normal, atraumatic, no cyanosis or edema Skin: Skin color, texture, turgor normal. No rashes or lesions Lymph nodes: Cervical, supraclavicular, and axillary nodes normal. No abnormal inguinal nodes palpated Neurologic: Grossly normal   Pelvic: External genitalia:  no lesions              Urethra:  normal appearing urethra with no masses, tenderness or lesions              Bartholin's and Skene's: normal                 Vagina: normal appearing vagina with normal color and discharge, no lesions              Cervix: anteverted, no bleeding following Pap, no cervical motion tenderness and no lesions              Pap taken: Yes.    Bimanual Exam:  Uterus:  normal size, contour, position, consistency, mobility, non-tender              Adnexa: normal adnexa and no mass, fullness, tenderness               Rectovaginal: Confirms               Anus:  normal sphincter tone, no lesions  Chaperone present: yes  A:  Well Woman with normal exam  Contraception NFP with withdrawal  Type 2 diabetes/hypertension management with PCP  Obese working on weight loss  Mammogram due  P:   Reviewed health and wellness pertinent to exam  Discussed importance of working on weight loss and health issues management. Stressed importance of follow up with PCP  Continue weight loss journey and exercise to her routine, which will help with calorie burn  Patient plans to schedule today  Pap smear: yes   counseled on breast self exam, mammography screening, adequate intake of calcium and vitamin D, diet and exercise  return annually or prn  An After Visit Summary was printed and given to the patient.

## 2017-05-16 ENCOUNTER — Other Ambulatory Visit (HOSPITAL_COMMUNITY)
Admission: RE | Admit: 2017-05-16 | Discharge: 2017-05-16 | Disposition: A | Discharge status: Home / Self Care | Payer: 59 | Source: Ambulatory Visit | Attending: Obstetrics & Gynecology | Admitting: Obstetrics & Gynecology

## 2017-05-16 ENCOUNTER — Encounter: Payer: Self-pay | Admitting: Certified Nurse Midwife

## 2017-05-16 ENCOUNTER — Ambulatory Visit (INDEPENDENT_AMBULATORY_CARE_PROVIDER_SITE_OTHER): Payer: 59 | Admitting: Certified Nurse Midwife

## 2017-05-16 ENCOUNTER — Ambulatory Visit: Payer: 59 | Admitting: Nurse Practitioner

## 2017-05-16 VITALS — BP 120/70 | HR 70 | Resp 16 | Ht 66.25 in | Wt 296.0 lb

## 2017-05-16 DIAGNOSIS — Z124 Encounter for screening for malignant neoplasm of cervix: Secondary | ICD-10-CM

## 2017-05-16 DIAGNOSIS — Z01419 Encounter for gynecological examination (general) (routine) without abnormal findings: Secondary | ICD-10-CM

## 2017-05-16 DIAGNOSIS — N912 Amenorrhea, unspecified: Secondary | ICD-10-CM | POA: Diagnosis not present

## 2017-05-16 DIAGNOSIS — Z0000 Encounter for general adult medical examination without abnormal findings: Secondary | ICD-10-CM

## 2017-05-16 LAB — POCT URINALYSIS DIPSTICK
Bilirubin, UA: NEGATIVE
Glucose, UA: NEGATIVE
Ketones, UA: NEGATIVE
Leukocytes, UA: NEGATIVE
NITRITE UA: NEGATIVE
Protein, UA: NEGATIVE
RBC UA: NEGATIVE
UROBILINOGEN UA: NEGATIVE U/dL — AB
pH, UA: 5 (ref 5.0–8.0)

## 2017-05-16 LAB — POCT URINE PREGNANCY: Preg Test, Ur: NEGATIVE

## 2017-05-16 NOTE — Patient Instructions (Signed)
EXERCISE AND DIET:  We recommended that you start or continue a regular exercise program for good health. Regular exercise means any activity that makes your heart beat faster and makes you sweat.  We recommend exercising at least 30 minutes per day at least 3 days a week, preferably 4 or 5.  We also recommend a diet low in fat and sugar.  Inactivity, poor dietary choices and obesity can cause diabetes, heart attack, stroke, and kidney damage, among others.    ALCOHOL AND SMOKING:  Women should limit their alcohol intake to no more than 7 drinks/beers/glasses of wine (combined, not each!) per week. Moderation of alcohol intake to this level decreases your risk of breast cancer and liver damage. And of course, no recreational drugs are part of a healthy lifestyle.  And absolutely no smoking or even second hand smoke. Most people know smoking can cause heart and lung diseases, but did you know it also contributes to weakening of your bones? Aging of your skin?  Yellowing of your teeth and nails?  CALCIUM AND VITAMIN D:  Adequate intake of calcium and Vitamin D are recommended.  The recommendations for exact amounts of these supplements seem to change often, but generally speaking 600 mg of calcium (either carbonate or citrate) and 800 units of Vitamin D per day seems prudent. Certain women may benefit from higher intake of Vitamin D.  If you are among these women, your doctor will have told you during your visit.    PAP SMEARS:  Pap smears, to check for cervical cancer or precancers,  have traditionally been done yearly, although recent scientific advances have shown that most women can have pap smears less often.  However, every woman still should have a physical exam from her gynecologist every year. It will include a breast check, inspection of the vulva and vagina to check for abnormal growths or skin changes, a visual exam of the cervix, and then an exam to evaluate the size and shape of the uterus and  ovaries.  And after 44 years of age, a rectal exam is indicated to check for rectal cancers. We will also provide age appropriate advice regarding health maintenance, like when you should have certain vaccines, screening for sexually transmitted diseases, bone density testing, colonoscopy, mammograms, etc.   MAMMOGRAMS:  All women over 40 years old should have a yearly mammogram. Many facilities now offer a "3D" mammogram, which may cost around $50 extra out of pocket. If possible,  we recommend you accept the option to have the 3D mammogram performed.  It both reduces the number of women who will be called back for extra views which then turn out to be normal, and it is better than the routine mammogram at detecting truly abnormal areas.    COLONOSCOPY:  Colonoscopy to screen for colon cancer is recommended for all women at age 50.  We know, you hate the idea of the prep.  We agree, BUT, having colon cancer and not knowing it is worse!!  Colon cancer so often starts as a polyp that can be seen and removed at colonscopy, which can quite literally save your life!  And if your first colonoscopy is normal and you have no family history of colon cancer, most women don't have to have it again for 10 years.  Once every ten years, you can do something that may end up saving your life, right?  We will be happy to help you get it scheduled when you are ready.    Be sure to check your insurance coverage so you understand how much it will cost.  It may be covered as a preventative service at no cost, but you should check your particular policy.      Calorie Counting for Weight Loss Calories are units of energy. Your body needs a certain amount of calories from food to keep you going throughout the day. When you eat more calories than your body needs, your body stores the extra calories as fat. When you eat fewer calories than your body needs, your body burns fat to get the energy it needs. Calorie counting means  keeping track of how many calories you eat and drink each day. Calorie counting can be helpful if you need to lose weight. If you make sure to eat fewer calories than your body needs, you should lose weight. Ask your health care provider what a healthy weight is for you. For calorie counting to work, you will need to eat the right number of calories in a day in order to lose a healthy amount of weight per week. A dietitian can help you determine how many calories you need in a day and will give you suggestions on how to reach your calorie goal.  A healthy amount of weight to lose per week is usually 1-2 lb (0.5-0.9 kg). This usually means that your daily calorie intake should be reduced by 500-750 calories.  Eating 1,200 - 1,500 calories per day can help most women lose weight.  Eating 1,500 - 1,800 calories per day can help most men lose weight.  What is my plan? My goal is to have __________ calories per day. If I have this many calories per day, I should lose around __________ pounds per week. What do I need to know about calorie counting? In order to meet your daily calorie goal, you will need to:  Find out how many calories are in each food you would like to eat. Try to do this before you eat.  Decide how much of the food you plan to eat.  Write down what you ate and how many calories it had. Doing this is called keeping a food log.  To successfully lose weight, it is important to balance calorie counting with a healthy lifestyle that includes regular activity. Aim for 150 minutes of moderate exercise (such as walking) or 75 minutes of vigorous exercise (such as running) each week. Where do I find calorie information?  The number of calories in a food can be found on a Nutrition Facts label. If a food does not have a Nutrition Facts label, try to look up the calories online or ask your dietitian for help. Remember that calories are listed per serving. If you choose to have more than one  serving of a food, you will have to multiply the calories per serving by the amount of servings you plan to eat. For example, the label on a package of bread might say that a serving size is 1 slice and that there are 90 calories in a serving. If you eat 1 slice, you will have eaten 90 calories. If you eat 2 slices, you will have eaten 180 calories. How do I keep a food log? Immediately after each meal, record the following information in your food log:  What you ate. Don't forget to include toppings, sauces, and other extras on the food.  How much you ate. This can be measured in cups, ounces, or number of items.  How   many calories each food and drink had.  The total number of calories in the meal.  Keep your food log near you, such as in a small notebook in your pocket, or use a mobile app or website. Some programs will calculate calories for you and show you how many calories you have left for the day to meet your goal. What are some calorie counting tips?  Use your calories on foods and drinks that will fill you up and not leave you hungry: ? Some examples of foods that fill you up are nuts and nut butters, vegetables, lean proteins, and high-fiber foods like whole grains. High-fiber foods are foods with more than 5 g fiber per serving. ? Drinks such as sodas, specialty coffee drinks, alcohol, and juices have a lot of calories, yet do not fill you up.  Eat nutritious foods and avoid empty calories. Empty calories are calories you get from foods or beverages that do not have many vitamins or protein, such as candy, sweets, and soda. It is better to have a nutritious high-calorie food (such as an avocado) than a food with few nutrients (such as a bag of chips).  Know how many calories are in the foods you eat most often. This will help you calculate calorie counts faster.  Pay attention to calories in drinks. Low-calorie drinks include water and unsweetened drinks.  Pay attention to  nutrition labels for "low fat" or "fat free" foods. These foods sometimes have the same amount of calories or more calories than the full fat versions. They also often have added sugar, starch, or salt, to make up for flavor that was removed with the fat.  Find a way of tracking calories that works for you. Get creative. Try different apps or programs if writing down calories does not work for you. What are some portion control tips?  Know how many calories are in a serving. This will help you know how many servings of a certain food you can have.  Use a measuring cup to measure serving sizes. You could also try weighing out portions on a kitchen scale. With time, you will be able to estimate serving sizes for some foods.  Take some time to put servings of different foods on your favorite plates, bowls, and cups so you know what a serving looks like.  Try not to eat straight from a bag or box. Doing this can lead to overeating. Put the amount you would like to eat in a cup or on a plate to make sure you are eating the right portion.  Use smaller plates, glasses, and bowls to prevent overeating.  Try not to multitask (for example, watch TV or use your computer) while eating. If it is time to eat, sit down at a table and enjoy your food. This will help you to know when you are full. It will also help you to be aware of what you are eating and how much you are eating. What are tips for following this plan? Reading food labels  Check the calorie count compared to the serving size. The serving size may be smaller than what you are used to eating.  Check the source of the calories. Make sure the food you are eating is high in vitamins and protein and low in saturated and trans fats. Shopping  Read nutrition labels while you shop. This will help you make healthy decisions before you decide to purchase your food.  Make a grocery list and stick   to it. Cooking  Try to cook your favorite foods in a  healthier way. For example, try baking instead of frying.  Use low-fat dairy products. Meal planning  Use more fruits and vegetables. Half of your plate should be fruits and vegetables.  Include lean proteins like poultry and fish. How do I count calories when eating out?  Ask for smaller portion sizes.  Consider sharing an entree and sides instead of getting your own entree.  If you get your own entree, eat only half. Ask for a box at the beginning of your meal and put the rest of your entree in it so you are not tempted to eat it.  If calories are listed on the menu, choose the lower calorie options.  Choose dishes that include vegetables, fruits, whole grains, low-fat dairy products, and lean protein.  Choose items that are boiled, broiled, grilled, or steamed. Stay away from items that are buttered, battered, fried, or served with cream sauce. Items labeled "crispy" are usually fried, unless stated otherwise.  Choose water, low-fat milk, unsweetened iced tea, or other drinks without added sugar. If you want an alcoholic beverage, choose a lower calorie option such as a glass of wine or light beer.  Ask for dressings, sauces, and syrups on the side. These are usually high in calories, so you should limit the amount you eat.  If you want a salad, choose a garden salad and ask for grilled meats. Avoid extra toppings like bacon, cheese, or fried items. Ask for the dressing on the side, or ask for olive oil and vinegar or lemon to use as dressing.  Estimate how many servings of a food you are given. For example, a serving of cooked rice is  cup or about the size of half a baseball. Knowing serving sizes will help you be aware of how much food you are eating at restaurants. The list below tells you how big or small some common portion sizes are based on everyday objects: ? 1 oz-4 stacked dice. ? 3 oz-1 deck of cards. ? 1 tsp-1 die. ? 1 Tbsp- a ping-pong ball. ? 2 Tbsp-1 ping-pong  ball. ?  cup- baseball. ? 1 cup-1 baseball. Summary  Calorie counting means keeping track of how many calories you eat and drink each day. If you eat fewer calories than your body needs, you should lose weight.  A healthy amount of weight to lose per week is usually 1-2 lb (0.5-0.9 kg). This usually means reducing your daily calorie intake by 500-750 calories.  The number of calories in a food can be found on a Nutrition Facts label. If a food does not have a Nutrition Facts label, try to look up the calories online or ask your dietitian for help.  Use your calories on foods and drinks that will fill you up, and not on foods and drinks that will leave you hungry.  Use smaller plates, glasses, and bowls to prevent overeating. This information is not intended to replace advice given to you by your health care provider. Make sure you discuss any questions you have with your health care provider. Document Released: 07/08/2005 Document Revised: 06/07/2016 Document Reviewed: 06/07/2016 Elsevier Interactive Patient Education  2017 [employer]  

## 2017-05-20 LAB — CYTOLOGY - PAP
Adequacy: ABSENT
Diagnosis: NEGATIVE

## 2017-09-17 DIAGNOSIS — J301 Allergic rhinitis due to pollen: Secondary | ICD-10-CM | POA: Diagnosis not present

## 2017-09-17 DIAGNOSIS — J3089 Other allergic rhinitis: Secondary | ICD-10-CM | POA: Diagnosis not present

## 2017-09-17 DIAGNOSIS — J0100 Acute maxillary sinusitis, unspecified: Secondary | ICD-10-CM | POA: Diagnosis not present

## 2017-09-17 DIAGNOSIS — K219 Gastro-esophageal reflux disease without esophagitis: Secondary | ICD-10-CM | POA: Diagnosis not present

## 2017-10-31 ENCOUNTER — Other Ambulatory Visit: Payer: Self-pay | Admitting: Nurse Practitioner

## 2017-10-31 DIAGNOSIS — Z139 Encounter for screening, unspecified: Secondary | ICD-10-CM

## 2017-11-12 DIAGNOSIS — I10 Essential (primary) hypertension: Secondary | ICD-10-CM | POA: Diagnosis not present

## 2017-11-12 DIAGNOSIS — E559 Vitamin D deficiency, unspecified: Secondary | ICD-10-CM | POA: Diagnosis not present

## 2017-11-12 DIAGNOSIS — E7800 Pure hypercholesterolemia, unspecified: Secondary | ICD-10-CM | POA: Diagnosis not present

## 2017-11-12 DIAGNOSIS — D509 Iron deficiency anemia, unspecified: Secondary | ICD-10-CM | POA: Diagnosis not present

## 2017-11-12 DIAGNOSIS — R7303 Prediabetes: Secondary | ICD-10-CM | POA: Diagnosis not present

## 2017-11-21 ENCOUNTER — Other Ambulatory Visit: Payer: Self-pay | Admitting: Family Medicine

## 2017-11-21 ENCOUNTER — Ambulatory Visit
Admission: RE | Admit: 2017-11-21 | Discharge: 2017-11-21 | Disposition: A | Discharge status: Home / Self Care | Payer: 59 | Source: Ambulatory Visit | Attending: Nurse Practitioner | Admitting: Nurse Practitioner

## 2017-11-21 DIAGNOSIS — Z1231 Encounter for screening mammogram for malignant neoplasm of breast: Secondary | ICD-10-CM | POA: Diagnosis not present

## 2017-11-21 DIAGNOSIS — Z139 Encounter for screening, unspecified: Secondary | ICD-10-CM

## 2018-05-14 DIAGNOSIS — E559 Vitamin D deficiency, unspecified: Secondary | ICD-10-CM | POA: Diagnosis not present

## 2018-05-14 DIAGNOSIS — I10 Essential (primary) hypertension: Secondary | ICD-10-CM | POA: Diagnosis not present

## 2018-05-14 DIAGNOSIS — R7303 Prediabetes: Secondary | ICD-10-CM | POA: Diagnosis not present

## 2018-05-14 DIAGNOSIS — E7800 Pure hypercholesterolemia, unspecified: Secondary | ICD-10-CM | POA: Diagnosis not present

## 2018-05-21 ENCOUNTER — Ambulatory Visit: Payer: 59 | Admitting: Certified Nurse Midwife

## 2018-06-16 ENCOUNTER — Encounter: Payer: Self-pay | Admitting: Certified Nurse Midwife

## 2018-06-16 ENCOUNTER — Other Ambulatory Visit: Payer: Self-pay

## 2018-06-16 ENCOUNTER — Ambulatory Visit (INDEPENDENT_AMBULATORY_CARE_PROVIDER_SITE_OTHER): Payer: 59 | Admitting: Certified Nurse Midwife

## 2018-06-16 VITALS — BP 124/64 | HR 70 | Resp 16 | Ht 65.5 in | Wt 300.0 lb

## 2018-06-16 DIAGNOSIS — Z8679 Personal history of other diseases of the circulatory system: Secondary | ICD-10-CM | POA: Diagnosis not present

## 2018-06-16 DIAGNOSIS — Z01419 Encounter for gynecological examination (general) (routine) without abnormal findings: Secondary | ICD-10-CM | POA: Diagnosis not present

## 2018-06-16 DIAGNOSIS — E663 Overweight: Secondary | ICD-10-CM | POA: Diagnosis not present

## 2018-06-16 DIAGNOSIS — R635 Abnormal weight gain: Secondary | ICD-10-CM

## 2018-06-16 NOTE — Progress Notes (Signed)
47 y.o. [id number] Married  African American Fe here for annual exam. Patient premenstrual and having irritability each month prior to onset of periods. Recent labs and aex with PCP all normal except for Hgb A1-C. Hypertension stable on medication. She is now working on diet for weight loss and glucose control. Aware she can improve this with keeping weight under control. No other issues today.  Patient's last menstrual period was 05/24/2018 (exact date).          Sexually active: Yes.    The current method of family planning is rhythm method.    Exercising: No.  exercise Smoker:  no  Review of Systems  Constitutional: Negative.   HENT: Negative.   Eyes: Negative.   Respiratory: Negative.   Cardiovascular: Negative.   Gastrointestinal: Negative.   Genitourinary: Negative.   Musculoskeletal: Negative.   Skin: Negative.   Neurological: Negative.   Endo/Heme/Allergies: Negative.   Psychiatric/Behavioral: Negative.     Health Maintenance: Pap:  03-14-14 neg HPV HR neg, 05-16-17 neg History of Abnormal Pap: cryo MMG:  11-21-17 category b density birads 1:neg Self Breast exams: no Colonoscopy:  none BMD:   none TDaP:  2014 Shingles: no Pneumonia: no Hep C and HIV: HIV done with pregnancy Labs:  If needed   reports that she has never smoked. She has never used smokeless tobacco. She reports that she does not drink alcohol or use drugs.  Past Medical History:  Diagnosis Date  . Abnormal Pap smear of cervix    many yrs ago  . Anemia   . Diabetes mellitus without complication (HCC)    controlled by diet  . History of stomach ulcers    age 67 or 68  . Hypertension   . STD (sexually transmitted disease) 1996   Hx HSV    Past Surgical History:  Procedure Laterality Date  . CESAREAN SECTION  1997  . CRYOTHERAPY     many yrs ago for abnormal pap smear  . GYNECOLOGIC CRYOSURGERY  1996   abnormal pap    Current Outpatient Medications  Medication Sig Dispense Refill  .  Cetirizine HCl (ZYRTEC PO) Take by mouth daily.    . Cholecalciferol (VITAMIN D PO) Take by mouth.    . Coenzyme Q10 (CO Q 10 PO) Take by mouth.    . olmesartan-hydrochlorothiazide (BENICAR HCT) 40-12.5 MG tablet Take 1 tablet by mouth daily.    . Omega-3 Fatty Acids (OMEGA 3 PO) Take by mouth.    Marland Kitchen omeprazole (PRILOSEC) 20 MG capsule     . Probiotic Product (PROBIOTIC PO) Take by mouth.    . Vitamin D, Ergocalciferol, (DRISDOL) 50000 UNITS CAPS capsule Once weekly     No current facility-administered medications for this visit.     Family History  Problem Relation Age of Onset  . Hypertension Father   . Diabetes Maternal Grandmother   . Hypertension Maternal Grandmother   . Breast cancer Maternal Grandmother 4       mastecomy with recurence to brain at 1  . Diabetes Maternal Grandfather   . Hypertension Maternal Grandfather     ROS:  Pertinent items are noted in HPI.  Otherwise, a comprehensive ROS was negative.  Exam:   BP 124/64   Pulse 70   Resp 16   Ht 5' 5.5" (1.664 m)   Wt 300 lb (136.1 kg)   LMP 05/24/2018 (Exact Date)   BMI 49.16 kg/m  Height: 5' 5.5" (166.4 cm) Ht Readings from Last 3 Encounters:  06/16/18 5' 5.5" (1.664 m)  05/16/17 5' 6.25" (1.683 m)  05/10/16 5' 6.25" (1.683 m)    General appearance: alert, cooperative and appears stated age Head: Normocephalic, without obvious abnormality, atraumatic Neck: no adenopathy, supple, symmetrical, trachea midline and thyroid normal to inspection and palpation Lungs: clear to auscultation bilaterally Breasts: normal appearance, no masses or tenderness, No nipple retraction or dimpling, No nipple discharge or bleeding, No axillary or supraclavicular adenopathy Heart: regular rate and rhythm Abdomen: soft, non-tender; no masses,  no organomegaly Extremities: extremities normal, atraumatic, no cyanosis or edema Skin: Skin color, texture, turgor normal. No rashes or lesions Lymph nodes: Cervical, supraclavicular,  and axillary nodes normal. No abnormal inguinal nodes palpated Neurologic: Grossly normal   Pelvic: External genitalia:  no lesions, normal female              Urethra:  normal appearing urethra with no masses, tenderness or lesions              Bartholin's and Skene's: normal                 Vagina: normal appearing vagina with normal color and discharge, no lesions              Cervix: no cervical motion tenderness, no lesions and retroverted              Pap taken: No. Bimanual Exam:  Uterus:  normal size, contour, position, consistency, mobility, non-tender              Adnexa: normal adnexa and no mass, fullness, tenderness               Rectovaginal: Confirms               Anus:  normal sphincter tone, no lesions  Chaperone present: yes  A:  Well Woman with normal exam  Contraception Rhythm method (NFP)  Hypertension, GERD, Glucose  Management with PCP  Weight gain  Colonoscopy due P:   Reviewed health and wellness pertinent to exam  Discussed PMS improvement with B complex use from midcycle until menses starts. Patient will try and advise if no change.  Continue follow up with PCP as indicated.   Discussed importance of normal weight in health issues and mobility. Encouraged to continue to work on weight loss.  Discussed colonoscopy/cologard screening. Given pamphlet to call for insurance information. Discussed referral to GI if she decides to go ahead and schedule. Patient will advise and referral made if she desires. Questions addressed.  Pap smear: no  Discussed importance of dietary intake of calcium and vitamin D, SBE, mammogram yearly, regular exercise, risks/benefits of colonoscopy and current recommendations to begin at age 45.   return annually or prn  An After Visit Summary was printed and given to the patient.

## 2018-06-16 NOTE — Patient Instructions (Signed)

## 2019-06-23 ENCOUNTER — Ambulatory Visit: Payer: 59 | Admitting: Certified Nurse Midwife

## 2019-07-05 ENCOUNTER — Other Ambulatory Visit: Payer: Self-pay

## 2019-07-05 NOTE — Progress Notes (Signed)
48 y.o. [id number] Married  African American Fe here for annual exam. She missed one period in 01/2019 and then had period at 31 days, usually have 21 day cycles routinely, no change in amount or cramping. Taking B complex for PMS and working well. Contraception Rhythm method working well. Saw Dr. Nicholos Johns for aex in 05/2019 and all normal. PCP manages hypertension, diabetes, all medications. Stable per patient. Patient ready to schedule Cologard. Aware mammogram is due and planning to schedule soon. No other health issues today.  Patient's last menstrual period was 07/01/2019 (exact date).          Sexually active: Yes.    The current method of family planning is rhythm method.    Exercising: No.  exercise Smoker:  no  Review of Systems  Constitutional:       Irregular cycles  HENT: Negative.   Eyes: Negative.   Respiratory: Negative.   Cardiovascular: Negative.   Gastrointestinal: Negative.   Genitourinary: Negative.   Musculoskeletal: Negative.   Skin: Negative.   Neurological: Negative.   Endo/Heme/Allergies: Negative.   Psychiatric/Behavioral: Negative.     Health Maintenance: Pap:  05-16-17 neg History of Abnormal Pap: cryo MMG:  11-21-17 category b density birads 1:neg Self Breast exams: no Colonoscopy:  none BMD:   none TDaP: 2014 Shingles: no Pneumonia: no Hep C and HIV: HIV done with pregnancy Labs: PCP does all   reports that she has never smoked. She has never used smokeless tobacco. She reports that she does not drink alcohol or use drugs.  Past Medical History:  Diagnosis Date  . Abnormal Pap smear of cervix    many yrs ago  . Anemia   . Diabetes mellitus without complication (HCC)    controlled by diet  . History of stomach ulcers    age 14 or 87  . Hypertension   . STD (sexually transmitted disease) 1996   Hx HSV    Past Surgical History:  Procedure Laterality Date  . CESAREAN SECTION  1997  . CRYOTHERAPY     many yrs ago for abnormal pap smear  .  GYNECOLOGIC CRYOSURGERY  1996   abnormal pap    Current Outpatient Medications  Medication Sig Dispense Refill  . Cetirizine HCl (ZYRTEC PO) Take by mouth daily.    . Cholecalciferol (VITAMIN D PO) Take by mouth.    . Coenzyme Q10 (CO Q 10 PO) Take by mouth.    Marland Kitchen ibuprofen (ADVIL) 800 MG tablet Take 800 mg by mouth every 8 (eight) hours as needed.    . Multiple Vitamins-Minerals (ZINC PO) Take by mouth.    . olmesartan-hydrochlorothiazide (BENICAR HCT) 40-25 MG tablet Take 1 tablet by mouth daily.    . Omega-3 Fatty Acids (OMEGA 3 PO) Take by mouth.    Marland Kitchen omeprazole (PRILOSEC) 20 MG capsule     . Probiotic Product (PROBIOTIC PO) Take by mouth.     No current facility-administered medications for this visit.    Family History  Problem Relation Age of Onset  . Hypertension Father   . Diabetes Maternal Grandmother   . Hypertension Maternal Grandmother   . Breast cancer Maternal Grandmother 77       mastecomy with recurence to brain at 12  . Diabetes Maternal Grandfather   . Hypertension Maternal Grandfather     ROS:  Pertinent items are noted in HPI.  Otherwise, a comprehensive ROS was negative.  Exam:   BP 122/80   Pulse 70   Temp (!)  97.1 F (36.2 C) (Skin)   Resp 16   Ht 5' 5.75" (1.67 m)   Wt 296 lb (134.3 kg)   LMP 07/01/2019 (Exact Date)   BMI 48.14 kg/m  Height: 5' 5.75" (167 cm) Ht Readings from Last 3 Encounters:  07/06/19 5' 5.75" (1.67 m)  06/16/18 5' 5.5" (1.664 m)  05/16/17 5' 6.25" (1.683 m)    General appearance: alert, cooperative and appears stated age Head: Normocephalic, without obvious abnormality, atraumatic Neck: no adenopathy, supple, symmetrical, trachea midline and thyroid normal to inspection and palpation Lungs: clear to auscultation bilaterally Breasts: normal appearance, no masses or tenderness, No nipple retraction or dimpling, No nipple discharge or bleeding, No axillary or supraclavicular adenopathy Heart: regular rate and  rhythm Abdomen: soft, non-tender; no masses,  no organomegaly Extremities: extremities normal, atraumatic, no cyanosis or edema Skin: Skin color, texture, turgor normal. No rashes or lesions Lymph nodes: Cervical, supraclavicular, and axillary nodes normal. No abnormal inguinal nodes palpated Neurologic: Grossly normal   Pelvic: External genitalia:  no lesions              Urethra:  normal appearing urethra with no masses, tenderness or lesions              Bartholin's and Skene's: normal                 Vagina: normal appearing vagina with normal color and discharge, no lesions              Cervix: multiparous appearance, no cervical motion tenderness and no lesions              Pap taken: Yes.   Bimanual Exam:  Uterus:  normal size, contour, position, consistency, mobility, non-tender and retroverted              Adnexa: normal adnexa and no mass, fullness, tenderness               Rectovaginal: Confirms               Anus:  normal sphincter tone, no lesions  Chaperone present: yes  A:  Well Woman with normal exam  Contraception Rhythm method working well  Menstrual cycle change  Hypertension/diabetes with PCP management, stable  History of abnormal pap smear years ago with cryo  History of HSV no outbreaks  Mammogram due patient to schedule    P:   Reviewed health and wellness pertinent to exam  Discussed importance of cycle management for contraception. Patient aware and keeps up with cycles.  Discussed not usual for cycle changes as her age changes. Needs to advise if misses more than 3 months.  Continue follow up with PCP as indicated for good health management with Diabetes and hypertension.  Will advise if reoccurs, no medication needed.  Cologard requested for colon screening  Pap smear: yes   counseled on breast self exam, mammography screening, feminine hygiene, adequate intake of calcium and vitamin D, diet and exercise  return annually or prn  An After Visit  Summary was printed and given to the patient.

## 2019-07-06 ENCOUNTER — Other Ambulatory Visit (HOSPITAL_COMMUNITY)
Admission: RE | Admit: 2019-07-06 | Discharge: 2019-07-06 | Disposition: A | Discharge status: Home / Self Care | Payer: 59 | Source: Ambulatory Visit | Attending: Certified Nurse Midwife | Admitting: Certified Nurse Midwife

## 2019-07-06 ENCOUNTER — Other Ambulatory Visit: Payer: Self-pay

## 2019-07-06 ENCOUNTER — Encounter: Payer: Self-pay | Admitting: Certified Nurse Midwife

## 2019-07-06 ENCOUNTER — Ambulatory Visit (INDEPENDENT_AMBULATORY_CARE_PROVIDER_SITE_OTHER): Payer: 59 | Admitting: Certified Nurse Midwife

## 2019-07-06 VITALS — BP 122/80 | HR 70 | Temp 97.1°F | Resp 16 | Ht 65.75 in | Wt 296.0 lb

## 2019-07-06 DIAGNOSIS — Z8679 Personal history of other diseases of the circulatory system: Secondary | ICD-10-CM

## 2019-07-06 DIAGNOSIS — E663 Overweight: Secondary | ICD-10-CM | POA: Diagnosis not present

## 2019-07-06 DIAGNOSIS — Z124 Encounter for screening for malignant neoplasm of cervix: Secondary | ICD-10-CM | POA: Diagnosis not present

## 2019-07-06 DIAGNOSIS — Z01419 Encounter for gynecological examination (general) (routine) without abnormal findings: Secondary | ICD-10-CM

## 2019-07-06 NOTE — Patient Instructions (Signed)

## 2019-07-12 LAB — CYTOLOGY - PAP
Comment: NEGATIVE
Diagnosis: NEGATIVE
High risk HPV: NEGATIVE

## 2019-10-08 ENCOUNTER — Encounter: Payer: Self-pay | Admitting: Certified Nurse Midwife

## 2020-03-08 ENCOUNTER — Other Ambulatory Visit: Payer: Self-pay | Admitting: Family Medicine

## 2020-03-08 DIAGNOSIS — Z0000 Encounter for general adult medical examination without abnormal findings: Secondary | ICD-10-CM

## 2020-03-23 ENCOUNTER — Ambulatory Visit
Admission: RE | Admit: 2020-03-23 | Discharge: 2020-03-23 | Disposition: A | Discharge status: Home / Self Care | Payer: 59 | Source: Ambulatory Visit | Attending: Family Medicine | Admitting: Family Medicine

## 2020-03-23 ENCOUNTER — Other Ambulatory Visit: Payer: Self-pay

## 2020-03-23 DIAGNOSIS — Z0000 Encounter for general adult medical examination without abnormal findings: Secondary | ICD-10-CM

## 2020-07-07 ENCOUNTER — Ambulatory Visit: Payer: 59 | Admitting: Certified Nurse Midwife

## 2020-11-19 HISTORY — PX: COLONOSCOPY: SHX174

## 2021-02-09 ENCOUNTER — Other Ambulatory Visit: Payer: Self-pay | Admitting: Family Medicine

## 2021-02-09 DIAGNOSIS — Z1231 Encounter for screening mammogram for malignant neoplasm of breast: Secondary | ICD-10-CM

## 2021-04-02 ENCOUNTER — Other Ambulatory Visit: Payer: Self-pay

## 2021-04-02 ENCOUNTER — Ambulatory Visit
Admission: RE | Admit: 2021-04-02 | Discharge: 2021-04-02 | Disposition: A | Discharge status: Home / Self Care | Payer: 59 | Source: Ambulatory Visit

## 2021-04-02 DIAGNOSIS — Z1231 Encounter for screening mammogram for malignant neoplasm of breast: Secondary | ICD-10-CM

## 2021-07-03 LAB — HM COLONOSCOPY

## 2022-05-13 ENCOUNTER — Ambulatory Visit (INDEPENDENT_AMBULATORY_CARE_PROVIDER_SITE_OTHER): Payer: 59 | Admitting: Nurse Practitioner

## 2022-05-13 ENCOUNTER — Encounter: Payer: Self-pay | Admitting: Nurse Practitioner

## 2022-05-13 VITALS — BP 124/82 | HR 80 | Ht 65.75 in | Wt 303.0 lb

## 2022-05-13 DIAGNOSIS — Z01419 Encounter for gynecological examination (general) (routine) without abnormal findings: Secondary | ICD-10-CM

## 2022-05-13 DIAGNOSIS — N951 Menopausal and female climacteric states: Secondary | ICD-10-CM

## 2022-05-13 NOTE — Progress Notes (Signed)
Tammie Sims 12-Aug-1970 [id number]   History:  51 y.o. [id number] presents for annual exam. Monthly cycles but did miss one recently. Reports what she thinks are mild menopausal symptoms to include difficulty sleeping, hot flashes, night sweats, vaginal dryness and mood changes. She does see holistic provider. 1996 cryosurgery, normal pap since. HTN, DM (diet controlled) managed by PCP.   Gynecologic History Patient's last menstrual period was 04/09/2022 (exact date). Period Cycle (Days):  (28-30) Period Duration (Days): 7 Menstrual Flow:  (light to heavy, then slows, can get heavy towards end then lightens back up) Menstrual Control: Maxi pad Dysmenorrhea: (!) Mild Dysmenorrhea Symptoms: Cramping, Headache Contraception/Family planning: none Sexually active: Yes  Health Maintenance Last Pap: 07/06/2019. Results were: Normal neg HPV Last mammogram: 04/02/2021. Results were: Normal Last colonoscopy: 06/2021. Results were: Normal, 10-year recall Last Dexa: Not indicated  Past medical history, past surgical history, family history and social history were all reviewed and documented in the EPIC chart. Married. Works in Audiological scientist. 64 yo son.   ROS:  A ROS was performed and pertinent positives and negatives are included.  Exam:  Vitals:   05/13/22 1608  BP: 124/82  Pulse: 80  SpO2: 97%  Weight: (!) 303 lb (137.4 kg)  Height: 5' 5.75" (1.67 m)   Body mass index is 49.28 kg/m.  General appearance:  Normal Thyroid:  Symmetrical, normal in size, without palpable masses or nodularity. Respiratory  Auscultation:  Clear without wheezing or rhonchi Cardiovascular  Auscultation:  Regular rate, without rubs, murmurs or gallops  Edema/varicosities:  Not grossly evident Abdominal  Soft,nontender, without masses, guarding or rebound.  Liver/spleen:  No organomegaly noted  Hernia:  None appreciated  Skin  Inspection:  Grossly normal Breasts: Examined lying and  sitting.   Right: Without masses, retractions, nipple discharge or axillary adenopathy.   Left: Without masses, retractions, nipple discharge or axillary adenopathy. Genitourinary   Inguinal/mons:  Normal without inguinal adenopathy  External genitalia:  Normal appearing vulva with no masses, tenderness, or lesions  BUS/Urethra/Skene's glands:  Normal  Vagina:  Normal appearing with normal color and discharge, no lesions  Cervix:  Normal appearing without discharge or lesions  Uterus:  Difficult to palpate due to body habitus but no gross masses or tenderness  Adnexa/parametria:     Rt: Normal in size, without masses or tenderness.   Lt: Normal in size, without masses or tenderness.  Anus and perineum: Normal  Digital rectal exam: Normal sphincter tone without palpated masses or tenderness  Patient informed chaperone available to be present for breast and pelvic exam. Patient has requested no chaperone to be present. Patient has been advised what will be completed during breast and pelvic exam.   Assessment/Plan:  51 y.o. [id number] for annual exam.   Well female exam with routine gynecological exam - Education provided on SBEs, importance of preventative screenings, current guidelines, high calcium diet, regular exercise, and multivitamin daily. Labs with PCP.   Perimenopausal symptoms - Monthly cycles but did miss one recently. Reports what she thinks are mild menopausal symptoms to include difficulty sleeping, hot flashes, night sweats, vaginal dryness and mood changes. She is mostly concerned with mood changes and sleep. Recommend taking Magnesium nightly. We discussed perimenopause and what to expect as well as SSRI to help with mood changes and vasomotor symptoms. She will consider this.   Screening for cervical cancer - Cryosurgery in her early 54s, normal paps since. Will repeat at 5-year interval per guidelines.  Screening for breast cancer -  Normal mammogram history.  Continue  annual screenings.  Normal breast exam today.  Screening for colon cancer - 06/2021 colonoscopy. Will repeat at 10-year interval per GI's recommendation.   Return in 1 year for annual.     Tamela Gammon DNP, 4:42 PM 05/13/2022

## 2022-05-14 ENCOUNTER — Other Ambulatory Visit: Payer: Self-pay | Admitting: Family Medicine

## 2022-05-14 DIAGNOSIS — Z1231 Encounter for screening mammogram for malignant neoplasm of breast: Secondary | ICD-10-CM

## 2022-07-04 ENCOUNTER — Ambulatory Visit
Admission: RE | Admit: 2022-07-04 | Discharge: 2022-07-04 | Disposition: A | Discharge status: Home / Self Care | Payer: 59 | Source: Ambulatory Visit

## 2022-07-04 DIAGNOSIS — Z1231 Encounter for screening mammogram for malignant neoplasm of breast: Secondary | ICD-10-CM

## 2022-10-15 ENCOUNTER — Ambulatory Visit: Payer: 59

## 2022-10-15 ENCOUNTER — Telehealth: Payer: 59 | Admitting: Nurse Practitioner

## 2022-10-15 DIAGNOSIS — K219 Gastro-esophageal reflux disease without esophagitis: Secondary | ICD-10-CM | POA: Diagnosis not present

## 2022-10-15 DIAGNOSIS — I10 Essential (primary) hypertension: Secondary | ICD-10-CM

## 2022-10-15 MED ORDER — OMEPRAZOLE 20 MG PO CPDR: 20.0000 mg | DELAYED_RELEASE_CAPSULE | Freq: Every day | ORAL | 2 refills | Status: DC

## 2022-10-15 MED ORDER — OLMESARTAN MEDOXOMIL-HCTZ 40-25 MG PO TABS: 1.0000 | ORAL_TABLET | Freq: Every day | ORAL | 2 refills | Status: DC

## 2022-10-15 NOTE — Progress Notes (Signed)
Virtual Visit Consent   Jenaveve C Kiang, you are scheduled for a virtual visit with a [facility] provider today. Just as with appointments in the office, your consent must be obtained to participate. Your consent will be active for this visit and any virtual visit you may have with one of our providers in the next 365 days. If you have a MyChart account, a copy of this consent can be sent to you electronically.  As this is a virtual visit, video technology does not allow for your provider to perform a traditional examination. This may limit your provider's ability to fully assess your condition. If your provider identifies any concerns that need to be evaluated in person or the need to arrange testing (such as labs, EKG, etc.), we will make arrangements to do so. Although advances in technology are sophisticated, we cannot ensure that it will always work on either your end or our end. If the connection with a video visit is poor, the visit may have to be switched to a telephone visit. With either a video or telephone visit, we are not always able to ensure that we have a secure connection.  By engaging in this virtual visit, you consent to the provision of healthcare and authorize for your insurance to be billed (if applicable) for the services provided during this visit. Depending on your insurance coverage, you may receive a charge related to this service.  I need to obtain your verbal consent now. Are you willing to proceed with your visit today? Tammie Sims has provided verbal consent on 10/15/2022 for a virtual visit (video or telephone). Apolonio Schneiders, FNP  Date: 10/15/2022 6:43 PM  Virtual Visit via Video Note   I, Apolonio Schneiders, connected with  Tammie Sims  ([id number], February 27, 1971) on 10/15/22 at  6:45 PM EDT by a video-enabled telemedicine application and verified that I am speaking with the correct person using two identifiers.  Location: Patient: Virtual Visit Location Patient:  Home Provider: Virtual Visit Location Provider: Home Office   I discussed the limitations of evaluation and management by telemedicine and the availability of in person appointments. The patient expressed understanding and agreed to proceed.    History of Present Illness: Tammie Sims is a 52 y.o. who identifies as a female who was assigned female at birth, and is being seen today for a medication refill.  She is scheduled to see a new PCP on May 1st  She has run out of her omeprazole and her BP medications while awaiting new PCP appointment  Was well managed on HTN meds in the past   Denies SE from meds in the past  has run out of omeprazole and tried OTC PPI without relief    Problems:  Patient Active Problem List   Diagnosis Date Noted   Overweight 06/16/2018    Class: History of   Acne 03/10/2013   Irritant dermatitis 03/10/2013    Allergies:  Allergies  Allergen Reactions   Biaxin [Clarithromycin] Nausea And Vomiting   Medications:  Current Outpatient Medications:    b complex vitamins tablet, Take 1 tablet by mouth daily., Disp: , Rfl:    Cetirizine HCl (ZYRTEC PO), Take by mouth daily., Disp: , Rfl:    Cholecalciferol (VITAMIN D PO), Take by mouth., Disp: , Rfl:    Coenzyme Q10 (CO Q 10 PO), Take by mouth., Disp: , Rfl:    ibuprofen (ADVIL) 800 MG tablet, Take 800 mg by mouth every 8 (eight) hours  as needed., Disp: , Rfl:    MAGNESIUM PO, Take by mouth., Disp: , Rfl:    Multiple Vitamins-Minerals (ZINC PO), Take by mouth., Disp: , Rfl:    olmesartan-hydrochlorothiazide (BENICAR HCT) 40-25 MG tablet, Take 1 tablet by mouth daily., Disp: , Rfl:    Omega-3 Fatty Acids (OMEGA 3 PO), Take by mouth., Disp: , Rfl:    omeprazole (PRILOSEC) 20 MG capsule, , Disp: , Rfl:    Probiotic Product (PROBIOTIC PO), Take by mouth., Disp: , Rfl:   Observations/Objective: Patient is well-developed, well-nourished in no acute distress.  Resting comfortably  at home.  Head is  normocephalic, atraumatic.  No labored breathing.  Speech is clear and coherent with logical content.  Patient is alert and oriented at baseline.    Assessment and Plan: 1. Gastroesophageal reflux disease without esophagitis  - omeprazole (PRILOSEC) 20 MG capsule; Take 1 capsule (20 mg total) by mouth daily.  Dispense: 30 capsule; Refill: 2  2. Hypertension, unspecified type  - olmesartan-hydrochlorothiazide (BENICAR HCT) 40-25 MG tablet; Take 1 tablet by mouth daily.  Dispense: 30 tablet; Refill: 2     Follow Up Instructions: I discussed the assessment and treatment plan with the patient. The patient was provided an opportunity to ask questions and all were answered. The patient agreed with the plan and demonstrated an understanding of the instructions.  A copy of instructions were sent to the patient via MyChart unless otherwise noted below.    The patient was advised to call back or seek an in-person evaluation if the symptoms worsen or if the condition fails to improve as anticipated.  Time:  I spent 15 minutes with the patient via telehealth technology discussing the above problems/concerns.    Apolonio Schneiders, FNP

## 2022-11-20 ENCOUNTER — Ambulatory Visit (INDEPENDENT_AMBULATORY_CARE_PROVIDER_SITE_OTHER): Payer: 59 | Admitting: Medical

## 2022-11-20 ENCOUNTER — Encounter: Payer: Self-pay | Admitting: Medical

## 2022-11-20 VITALS — BP 120/80 | HR 56 | Ht 66.0 in | Wt 298.2 lb

## 2022-11-20 DIAGNOSIS — K219 Gastro-esophageal reflux disease without esophagitis: Secondary | ICD-10-CM

## 2022-11-20 DIAGNOSIS — Z0000 Encounter for general adult medical examination without abnormal findings: Secondary | ICD-10-CM

## 2022-11-20 DIAGNOSIS — Z13220 Encounter for screening for lipoid disorders: Secondary | ICD-10-CM | POA: Diagnosis not present

## 2022-11-20 DIAGNOSIS — I10 Essential (primary) hypertension: Secondary | ICD-10-CM

## 2022-11-20 DIAGNOSIS — E119 Type 2 diabetes mellitus without complications: Secondary | ICD-10-CM

## 2022-11-20 LAB — POCT URINALYSIS DIP (PROADVANTAGE DEVICE)
Bilirubin, UA: NEGATIVE
Blood, UA: NEGATIVE
Glucose, UA: NEGATIVE mg/dL
Ketones, POC UA: NEGATIVE mg/dL
Leukocytes, UA: NEGATIVE
Nitrite, UA: NEGATIVE
Protein Ur, POC: NEGATIVE mg/dL
Specific Gravity, Urine: 1.01
Urobilinogen, Ur: NEGATIVE
pH, UA: 6.5 (ref 5.0–8.0)

## 2022-11-20 LAB — COMPREHENSIVE METABOLIC PANEL

## 2022-11-20 LAB — LIPID PANEL

## 2022-11-20 LAB — CBC
Hemoglobin: 11.2 g/dL (ref 11.1–15.9)
MCV: 76 fL — ABNORMAL LOW (ref 79–97)
RDW: 14.9 % (ref 11.7–15.4)

## 2022-11-20 MED ORDER — OLMESARTAN MEDOXOMIL-HCTZ 40-25 MG PO TABS
1.0000 | ORAL_TABLET | Freq: Every day | ORAL | 3 refills | Status: DC
Start: 2022-11-20 — End: 2023-11-24

## 2022-11-20 MED ORDER — OMEPRAZOLE 20 MG PO CPDR
20.0000 mg | DELAYED_RELEASE_CAPSULE | Freq: Every day | ORAL | 1 refills | Status: DC
Start: 2022-11-20 — End: 2023-10-14

## 2022-11-20 NOTE — Progress Notes (Signed)
referred

## 2022-11-20 NOTE — Progress Notes (Signed)
Subjective:   HPI  Tammie Sims is a 52 y.o. female who presents for Chief Complaint  Patient presents with   new pt fasting cpe    New pt fasting cpe, discuss weight     Patient Care Team: Elias Else, MD (Inactive) as PCP - General (Family Medicine) Olivia Mackie, NP as Nurse Practitioner (Gynecology) Dr. Avelina Laine, Optometry in [place] Also diabetes specialist for eyes, [facility]   Concerns: Diabetes - currently not on medication.  Not current checking sugars.  No symptoms of concerns  HTN - compliant with BP medication  Uses OTC fish oil, declined statin in the past  Reviewed their medical, surgical, family, social, medication, and allergy history and updated chart as appropriate.  Allergies  Allergen Reactions   Biaxin [Clarithromycin] Nausea And Vomiting    Past Medical History:  Diagnosis Date   Abnormal Pap smear of cervix    many yrs ago   Allergy    Anemia    Diabetes mellitus without complication (HCC)    controlled by diet   History of stomach ulcers    age 15 or 72   Hypertension    STD (sexually transmitted disease) 1996   Hx HSV   Wears glasses     Current Outpatient Medications on File Prior to Visit  Medication Sig Dispense Refill   b complex vitamins tablet Take 1 tablet by mouth daily.     Cetirizine HCl (ZYRTEC PO) Take by mouth daily.     Cholecalciferol (VITAMIN D PO) Take by mouth.     Coenzyme Q10 (CO Q 10 PO) Take by mouth.     ibuprofen (ADVIL) 800 MG tablet Take 800 mg by mouth every 8 (eight) hours as needed.     MAGNESIUM PO Take by mouth.     Omega-3 Fatty Acids (OMEGA 3 PO) Take by mouth.     Probiotic Product (PROBIOTIC PO) Take by mouth.     No current facility-administered medications on file prior to visit.      Current Outpatient Medications:    b complex vitamins tablet, Take 1 tablet by mouth daily., Disp: , Rfl:    Cetirizine HCl (ZYRTEC PO), Take by mouth daily., Disp: , Rfl:     Cholecalciferol (VITAMIN D PO), Take by mouth., Disp: , Rfl:    Coenzyme Q10 (CO Q 10 PO), Take by mouth., Disp: , Rfl:    ibuprofen (ADVIL) 800 MG tablet, Take 800 mg by mouth every 8 (eight) hours as needed., Disp: , Rfl:    MAGNESIUM PO, Take by mouth., Disp: , Rfl:    Omega-3 Fatty Acids (OMEGA 3 PO), Take by mouth., Disp: , Rfl:    Probiotic Product (PROBIOTIC PO), Take by mouth., Disp: , Rfl:    olmesartan-hydrochlorothiazide (BENICAR HCT) 40-25 MG tablet, Take 1 tablet by mouth daily., Disp: 90 tablet, Rfl: 3   omeprazole (PRILOSEC) 20 MG capsule, Take 1 capsule (20 mg total) by mouth daily., Disp: 90 capsule, Rfl: 1  Family History  Problem Relation Age of Onset   Hypertension Father    Cancer Maternal Grandmother        breast   Diabetes Maternal Grandmother    Hypertension Maternal Grandmother    Breast cancer Maternal Grandmother 26       mastecomy with recurence to brain at 6   Heart disease Maternal Grandfather    Diabetes Maternal Grandfather    Hypertension Maternal Grandfather    Stroke Neg Hx  Past Surgical History:  Procedure Laterality Date   CESAREAN SECTION  1997   COLONOSCOPY  11/2020   [facility]   CRYOTHERAPY     many yrs ago for abnormal pap smear   GYNECOLOGIC CRYOSURGERY  1996   abnormal pap    Review of Systems  Constitutional:  Negative for chills, fever, malaise/fatigue and weight loss.  HENT:  Negative for congestion, ear pain, hearing loss, sore throat and tinnitus.   Eyes:  Negative for blurred vision, pain and redness.  Respiratory:  Negative for cough, hemoptysis and shortness of breath.   Cardiovascular:  Negative for chest pain, palpitations, orthopnea, claudication and leg swelling.  Gastrointestinal:  Negative for abdominal pain, blood in stool, constipation, diarrhea, nausea and vomiting.  Genitourinary:  Negative for dysuria, flank pain, frequency, hematuria and urgency.  Musculoskeletal:  Negative for falls, joint pain and  myalgias.  Skin:  Negative for itching and rash.  Neurological:  Negative for dizziness, tingling, speech change, weakness and headaches.  Endo/Heme/Allergies:  Negative for polydipsia. Does not bruise/bleed easily.  Psychiatric/Behavioral:  Negative for depression and memory loss. The patient is not nervous/anxious and does not have insomnia.        Objective:  BP 120/80   Pulse (!) 56   Ht 5\' 6"  (1.676 m)   Wt 298 lb 3.2 oz (135.3 kg)   LMP 10/21/2022   BMI 48.13 kg/m   General appearance: alert, no distress, WD/WN, African American female Skin: unremarkable HEENT: normocephalic, conjunctiva/corneas normal, sclerae anicteric, PERRLA, EOMi, nares patent, no discharge or erythema, pharynx normal Oral cavity: MMM, tongue normal, teeth normal Neck: supple, no lymphadenopathy, no thyromegaly, no masses, normal ROM, no bruits Chest: non tender, normal shape and expansion Heart: RRR, normal S1, S2, no murmurs Lungs: CTA bilaterally, no wheezes, rhonchi, or rales Abdomen: +bs, soft, non tender, non distended, no masses, no hepatomegaly, no splenomegaly, no bruits Back: non tender, normal ROM, no scoliosis Musculoskeletal: upper extremities non tender, no obvious deformity, normal ROM throughout, lower extremities non tender, no obvious deformity, normal ROM throughout Extremities: no edema, no cyanosis, no clubbing Pulses: 2+ symmetric, upper and lower extremities, normal cap refill Neurological: alert, oriented x 3, CN2-12 intact, strength normal upper extremities and lower extremities, sensation normal throughout, DTRs 2+ throughout, no cerebellar signs, gait normal Psychiatric: normal affect, behavior normal, pleasant  Breast/gyn/rectal - deferred to gynecology   Diabetic Foot Exam - Simple   Simple Foot Form Diabetic Foot exam was performed with the following findings: Yes 11/20/2022 12:13 PM  Visual Inspection See comments: Yes Sensation Testing Intact to touch and monofilament  testing bilaterally: Yes Pulse Check See comments: Yes Comments 1+ pedal pulses, flat feet       Assessment and Plan :   Encounter Diagnoses  Name Primary?   Encounter for health maintenance examination in adult Yes   Diabetes mellitus type 2, diet-controlled (HCC)    Screening for lipid disorders    Essential hypertension, benign    Gastroesophageal reflux disease, unspecified whether esophagitis present    Hypertension, unspecified type    Gastroesophageal reflux disease without esophagitis      This visit was a preventative care visit, also known as wellness visit or routine physical.   Topics typically include healthy lifestyle, diet, exercise, preventative care, vaccinations, sick and well care, proper use of emergency dept and after hours care, as well as other concerns.    Separate significant issues discussed: Diet controlled diabetes-updated labs today  HTN - continue current  medication  GERD - does fine on Omeprazole 20mg  daily    General Recommendations: Continue to return yearly for your annual wellness and preventative care visits.  This gives [place] a chance to discuss healthy lifestyle, exercise, vaccinations, review your chart record, and perform screenings where appropriate.  I recommend you see your eye doctor yearly for routine vision care.  I recommend you see your dentist yearly for routine dental care including hygiene visits twice yearly.   Vaccination recommendations were reviewed Immunization History  Administered Date(s) Administered   DTaP 07/22/2012    Vaccine recommendations: We will get copy of prior vaccines through prior PCP   Screening for cancer: Colon cancer screening: We will request last colonoscopy report from [facility]  Breast cancer screening: You should perform a self breast exam monthly.   We reviewed recommendations for regular mammograms and breast cancer screening. Last mammogram: 06/2022 normal  Cervical cancer  screening: We reviewed recommendations for pap smear screening. Last pap - will sign for records today   Skin cancer screening: Check your skin regularly for new changes, growing lesions, or other lesions of concern Come in for evaluation if you have skin lesions of concern.  Lung cancer screening: If you have a greater than 20 pack year history of tobacco use, then you may qualify for lung cancer screening with a chest CT scan.   Please call your insurance company to inquire about coverage for this test.  Pancreatic cancer: no current screening test is available routinely recommended.  (Risk factors: Smoking, overweight or obese, diabetes, chronic pancreatitis, work Nurse, mental health, Solicitor, 19 year old or greater, female greater than female, African-American, family history of pancreatic cancer, hereditary breast, ovarian, melanoma, Lynch, Peutz-jeghers).  We currently don't have screenings for other cancers besides breast, cervical, colon, and lung cancers.  If you have a strong family history of cancer or have other cancer screening concerns, please let me know.    Bone health: Get at least 150 minutes of aerobic exercise weekly Get weight bearing exercise at least once weekly Bone density test:  A bone density test is an imaging test that uses a type of X-ray to measure the amount of calcium and other minerals in your bones. The test may be used to diagnose or screen you for a condition that causes weak or thin bones (osteoporosis), predict your risk for a broken bone (fracture), or determine how well your osteoporosis treatment is working. The bone density test is recommended for females 65 and older, or females or males <65 if certain risk factors such as thyroid disease, long term use of steroids such as for asthma or rheumatological issues, vitamin D deficiency, estrogen deficiency, family history of osteoporosis, self or family history of fragility fracture in first degree  relative.    Heart health: Get at least 150 minutes of aerobic exercise weekly Limit alcohol It is important to maintain a healthy blood pressure and healthy cholesterol numbers  Heart disease screening: Screening for heart disease includes screening for blood pressure, fasting lipids, glucose/diabetes screening, BMI height to weight ratio, reviewed of smoking status, physical activity, and diet.    Goals include blood pressure 120/80 or less, maintaining a healthy lipid/cholesterol profile, preventing diabetes or keeping diabetes numbers under good control, not smoking or using tobacco products, exercising most days per week or at least 150 minutes per week of exercise, and eating healthy variety of fruits and vegetables, healthy oils, and avoiding unhealthy food choices like fried food, fast food, high sugar and high  cholesterol foods.    Other tests may possibly include EKG test, CT coronary calcium score, echocardiogram, exercise treadmill stress test.   Consider CT coronary test   Vascular disease screening: For high risk individuals including smokers, diabetes, patients with known heart disease or high blood pressure, kidney disease, and others, screening for vascular disease or atherosclerosis of the arteries is available.  Examples may include carotid ultrasound, abdominal aortic ultrasound, ABI blood flow screening in the legs, thoracic aorta screening.  Consider ABI screening   Medical care options: I recommend you continue to seek care here first for routine care.  We try really hard to have available appointments Monday through Friday daytime hours for sick visits, acute visits, and physicals.  Urgent care should be used for after hours and weekends for significant issues that cannot wait till the next day.  The emergency department should be used for significant potentially life-threatening emergencies.  The emergency department is expensive, can often have long wait times for  less significant concerns, so try to utilize primary care, urgent care, or telemedicine when possible to avoid unnecessary trips to the emergency department.  Virtual visits and telemedicine have been introduced since the pandemic started in 2020, and can be convenient ways to receive medical care.  We offer virtual appointments as well to assist you in a variety of options to seek medical care.   Legal Take the time to do a Last Will and Testament, advanced directives including Healthcare Power of Attorney and Living Will documents.  Do not leave your family with burdens that can be handled ahead of time.   Advanced Directives: I recommend you consider completing a Health Care Power of Attorney and Living Will.   These documents respect your wishes and help alleviate burdens on your loved ones if you were to become terminally ill or be in a position to need those documents enforced.    You can complete Advanced Directives yourself, have them notarized, then have copies made for our office, for you and for anybody you feel should have them in safe keeping.  Or, you can have an attorney prepare these documents.   If you haven't updated your Last Will and Testament in a while, it may be worthwhile having an attorney prepare these documents together and save on some costs.       Spiritual and Emotional Health Keeping a healthy spiritual life can help you better manage your physical health. Your spiritual life can help you to cope with any issues that may arise with your physical health.  Balance can keep [place] healthy and help [place] to recover.  If you are struggling with your spiritual health there are questions that you may want to ask yourself:  What makes me feel most complete? When do I feel most connected to the rest of the world? Where do I find the most inner strength? What am I doing when I feel whole?  Helpful tips: Being in nature. Some people feel very connected and at peace when they  are walking outdoors or are outside. Helping others. Some feel the largest sense of wellbeing when they are of service to others. Being of service can take on many forms. It can be doing volunteer work, being kind to strangers, or offering a hand to a friend in need. Gratitude. Some people find they feel the most connected when they remain grateful. They may make lists of all the things they are grateful for or say a thank you out  loud for all they have.    Emotional Health Are you in tune with your emotional health?  Check out this link: http://www.marquez-love.com/   Financial Health Make sure you use a budget for your personal finances Make sure you are insured against risks (health insurance, life insurance, auto insurance, etc) Save more, spend less Set financial goals If you need help in this area, good resources include counseling through [employer] or other community resources, have a meeting with a Social research officer, government, and a good resource is Salley Slaughter podcast    Kaiya was seen today for new pt fasting cpe.  Diagnoses and all orders for this visit:  Encounter for health maintenance examination in adult -     Comprehensive metabolic panel -     CBC -     Lipid panel -     Hemoglobin A1c -     Microalbumin/Creatinine Ratio, Urine -     POCT Urinalysis DIP (Proadvantage Device)  Diabetes mellitus type 2, diet-controlled (HCC) -     Hemoglobin A1c -     Amb ref to Medical Nutrition Therapy-MNT  Screening for lipid disorders -     Lipid panel  Essential hypertension, benign -     POCT Urinalysis DIP (Proadvantage Device)  Gastroesophageal reflux disease, unspecified whether esophagitis present  Hypertension, unspecified type -     olmesartan-hydrochlorothiazide (BENICAR HCT) 40-25 MG tablet; Take 1 tablet by mouth daily.  Gastroesophageal reflux disease without esophagitis -     omeprazole (PRILOSEC) 20 MG capsule; Take 1 capsule (20 mg total)  by mouth daily.   Follow-up pending labs, yearly for physical

## 2022-11-21 LAB — LIPID PANEL
Chol/HDL Ratio: 3.3 ratio (ref 0.0–4.4)
Cholesterol, Total: 208 mg/dL — ABNORMAL HIGH (ref 100–199)
VLDL Cholesterol Cal: 12 mg/dL (ref 5–40)

## 2022-11-21 LAB — COMPREHENSIVE METABOLIC PANEL
ALT: 16 IU/L (ref 0–32)
AST: 17 IU/L (ref 0–40)
Albumin/Globulin Ratio: 1.5 (ref 1.2–2.2)
Albumin: 4.3 g/dL (ref 3.9–4.9)
Alkaline Phosphatase: 113 IU/L (ref 44–121)
BUN/Creatinine Ratio: 17 (ref 9–23)
BUN: 15 mg/dL (ref 6–24)
Bilirubin Total: <0.2 mg/dL (ref 0.0–1.2)
Calcium: 9.6 mg/dL (ref 8.7–10.2)
Chloride: 102 mmol/L (ref 96–106)
Globulin, Total: 2.8 g/dL (ref 1.5–4.5)
Glucose: 103 mg/dL — ABNORMAL HIGH (ref 70–99)
Potassium: 4.2 mmol/L (ref 3.5–5.2)
Sodium: 143 mmol/L (ref 134–144)
eGFR: 78 mL/min/{1.73_m2} (ref >59)

## 2022-11-21 LAB — CBC
Hematocrit: 35.8 % (ref 34.0–46.6)
MCH: 23.9 pg — ABNORMAL LOW (ref 26.6–33.0)
MCHC: 31.3 g/dL — ABNORMAL LOW (ref 31.5–35.7)
Platelets: 267 10*3/uL (ref 150–450)
RBC: 4.69 x10E6/uL (ref 3.77–5.28)
WBC: 7.2 10*3/uL (ref 3.4–10.8)

## 2022-11-21 LAB — MICROALBUMIN / CREATININE URINE RATIO
Creatinine, Urine: 96.1 mg/dL
Microalb/Creat Ratio: <3 mg/g creat (ref 0–29)
Microalbumin, Urine: <3 ug/mL

## 2022-11-21 LAB — HEMOGLOBIN A1C
Est. average glucose Bld gHb Est-mCnc: 148 mg/dL
Hgb A1c MFr Bld: 6.8 % — ABNORMAL HIGH (ref 4.8–5.6)

## 2022-11-21 NOTE — Progress Notes (Signed)
Results sent through MyChart

## 2022-12-28 IMAGING — MG MM DIGITAL SCREENING BILAT W/ TOMO AND CAD
8 series · 8 of 24 positions shown · non-contrast
Comparison: Previous exam(s).

CLINICAL DATA: Screening.

EXAM:
DIGITAL SCREENING BILATERAL MAMMOGRAM WITH TOMOSYNTHESIS AND CAD
TECHNIQUE: Bilateral screening digital craniocaudal and mediolateral oblique
mammograms were obtained. Bilateral screening digital breast
tomosynthesis was performed. The images were evaluated with
computer-aided detection.

[R MLO synth-2D]
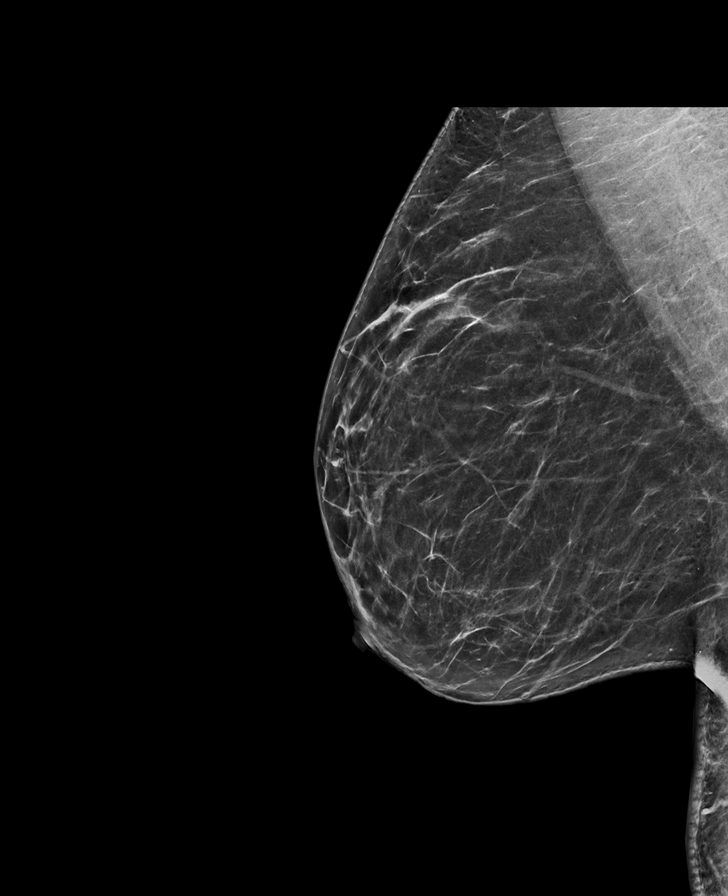

[L CC synth-2D]
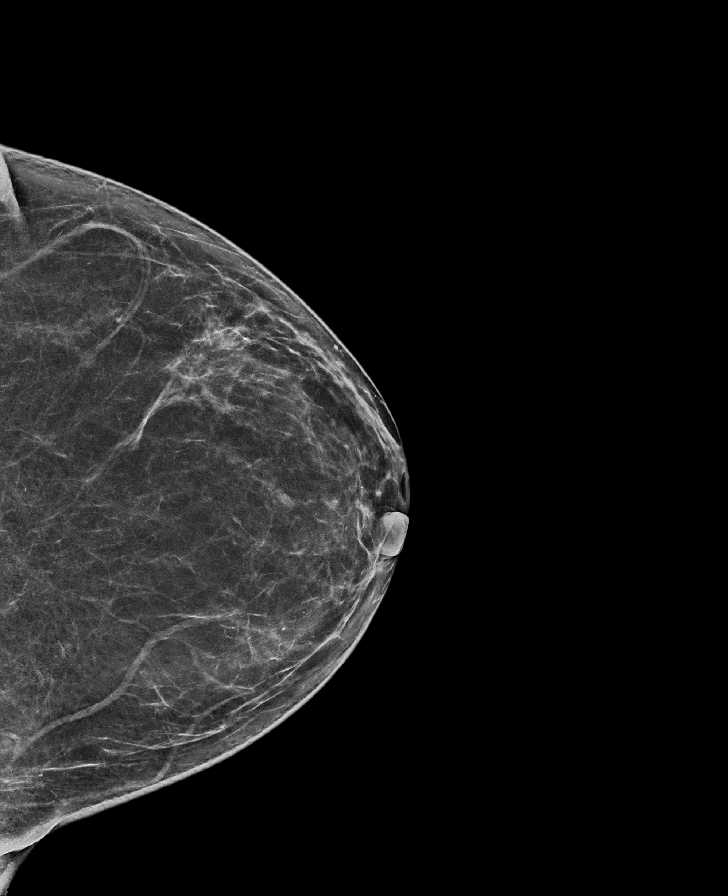

[L MLO synth-2D]
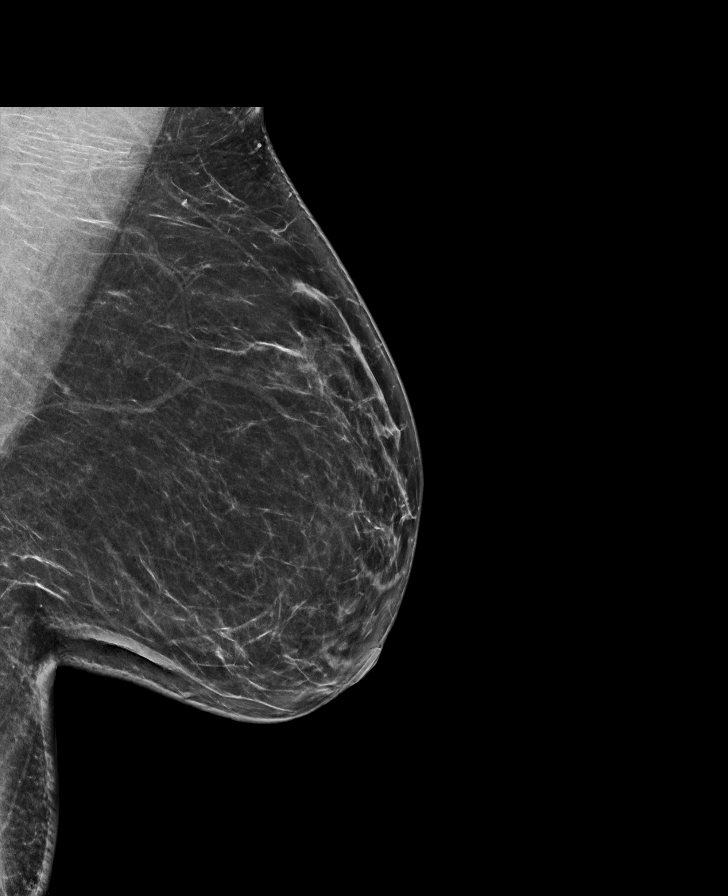

[R CC synth-2D]
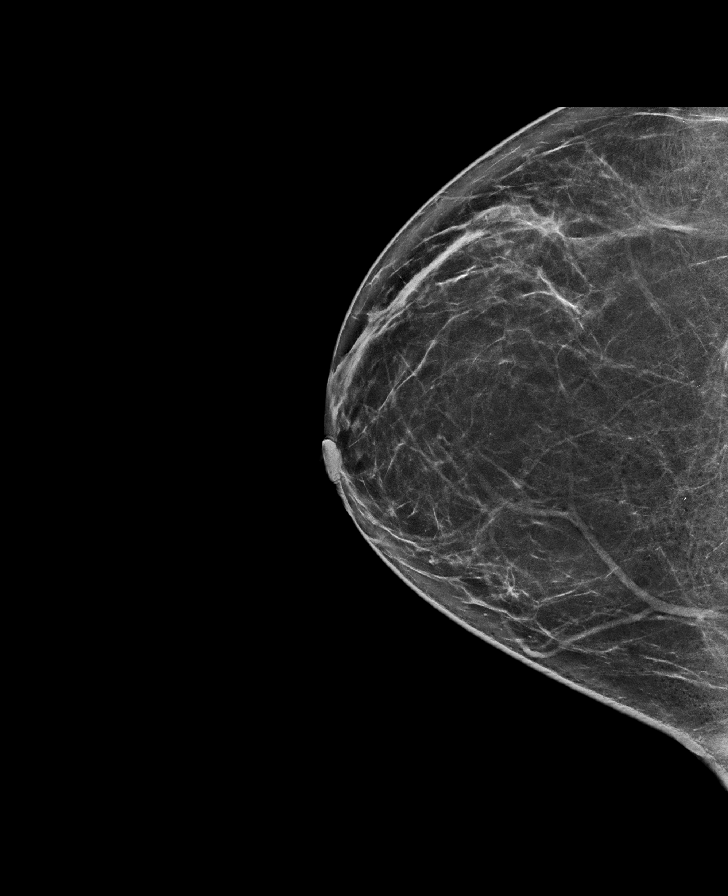

[L CC tomo · tomo slice 35/69.0]
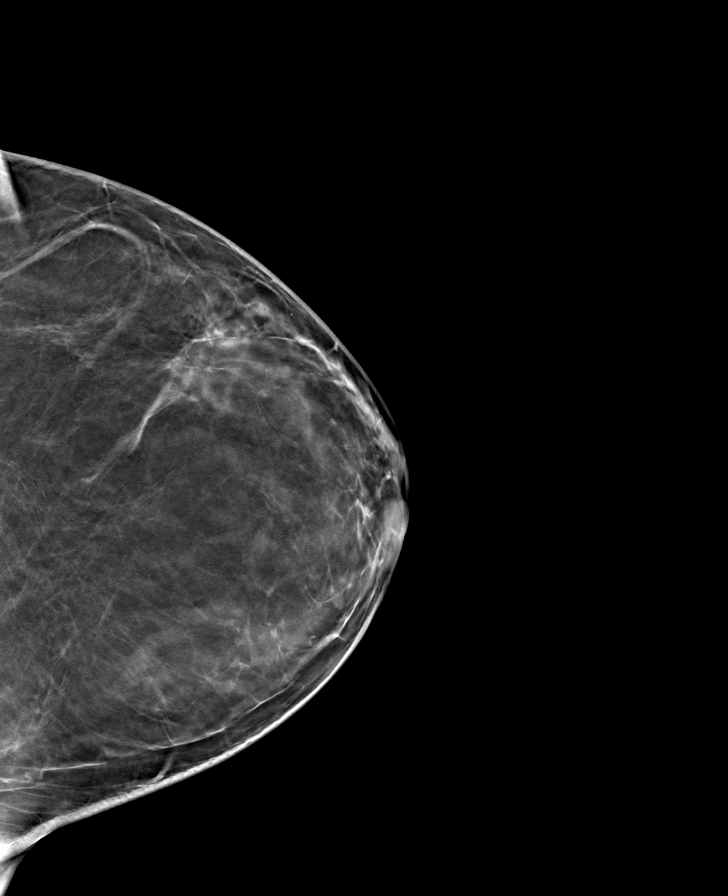

[L MLO tomo · tomo slice 41/81.0]
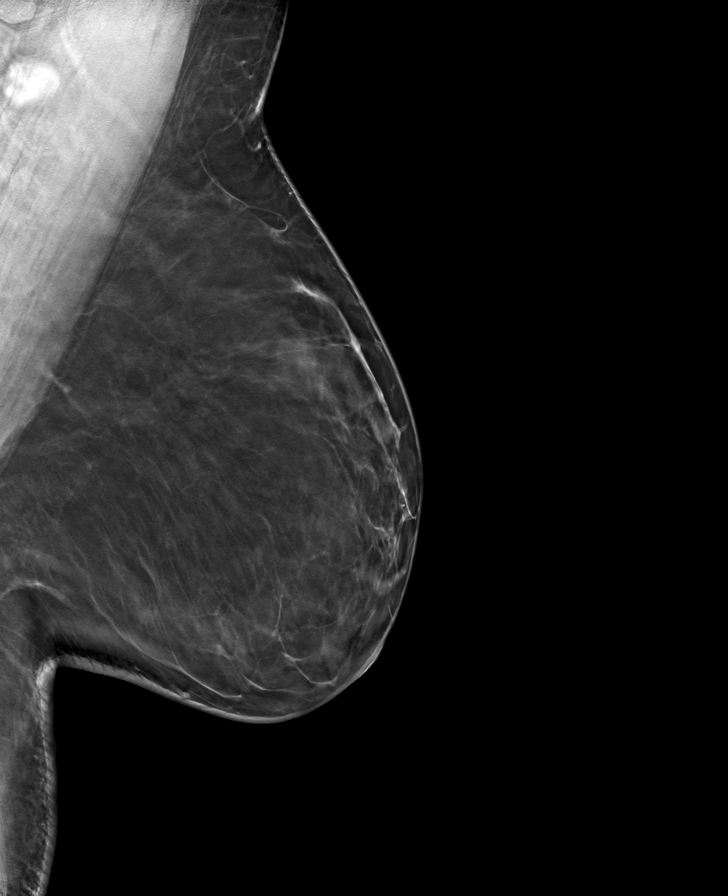

[R MLO tomo · tomo slice 39/76.0]
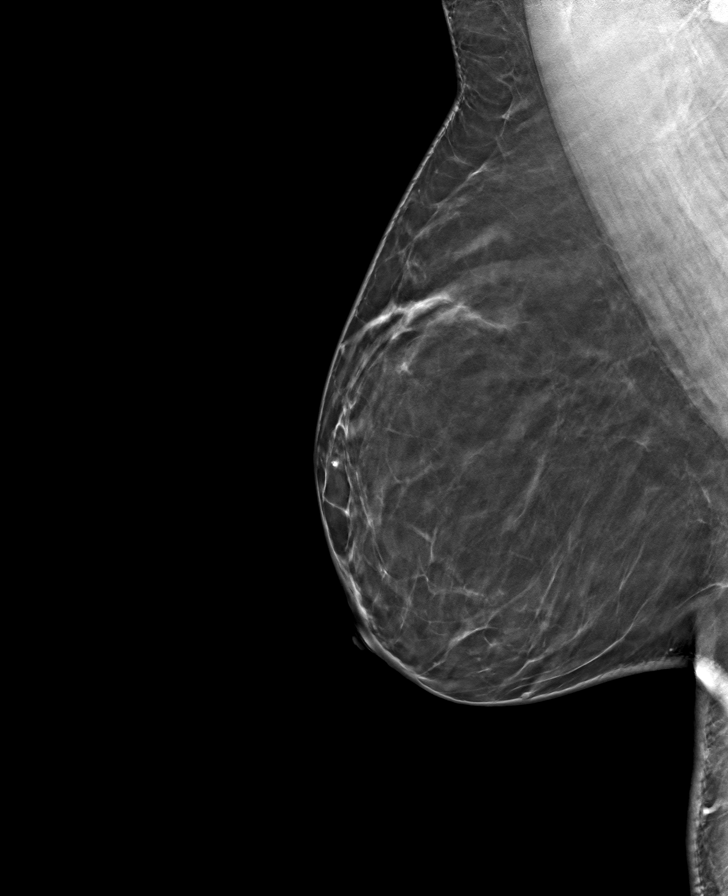

[R CC tomo · tomo slice 37/74.0]
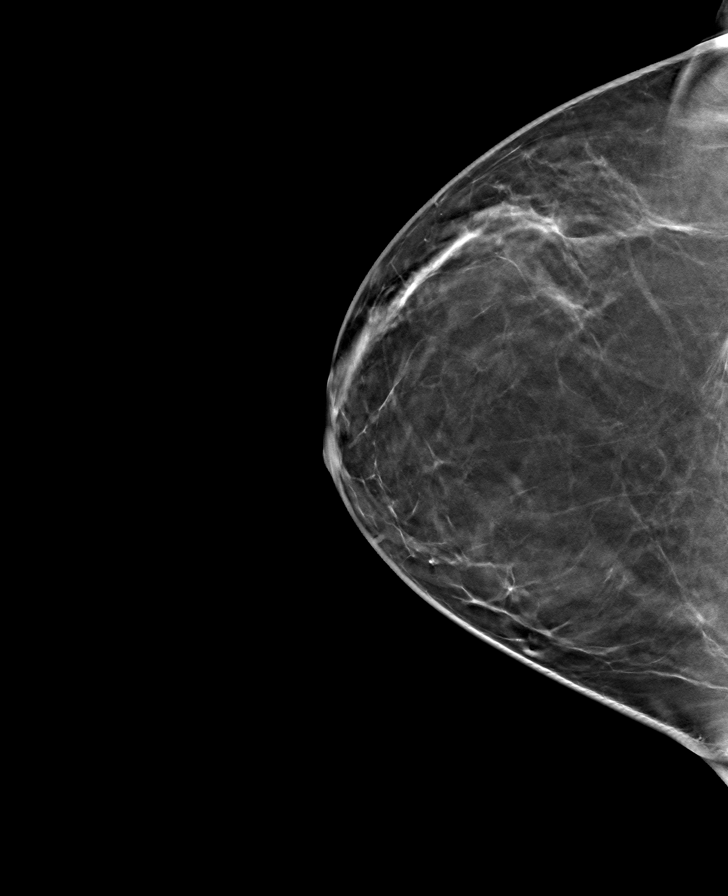

[8 of 24 positions shown; findings below may reference images not displayed]

ACR Breast Density Category b: There are scattered areas of
fibroglandular density.
FINDINGS: There are no findings suspicious for malignancy.
IMPRESSION: No mammographic evidence of malignancy. A result letter of this
screening mammogram will be mailed directly to the patient.

RECOMMENDATION:
Screening mammogram in one year. (Code:51-O-LD2)

BI-RADS CATEGORY  1: Negative.

## 2023-02-11 ENCOUNTER — Encounter: Payer: Self-pay | Admitting: Internal Medicine

## 2023-03-03 ENCOUNTER — Other Ambulatory Visit: Payer: Self-pay | Admitting: Medical

## 2023-03-03 MED ORDER — SEMAGLUTIDE(0.25 OR 0.5MG/DOS) 2 MG/3ML ~~LOC~~ SOPN: 0.2500 mg | PEN_INJECTOR | SUBCUTANEOUS | 1 refills | Status: DC

## 2023-03-14 ENCOUNTER — Telehealth: Payer: Self-pay | Admitting: Medical

## 2023-03-14 NOTE — Telephone Encounter (Signed)
Pt would like to come in and have her A1c rechecked, is this ok?

## 2023-03-14 NOTE — Telephone Encounter (Signed)
Pt called & states her Ozempic with insurance is $463, I told her how to find  & print discount card online & call back after she calls pharmacy with that info

## 2023-03-27 NOTE — Telephone Encounter (Signed)
With discount card Ozempic still over $300.  I had her call her insurance & she has $3000 deductible.  Still has $2515 left for this year.  I explained that all GLP1's were going to be expensive like that until her deductible is met.  Only inexpensive option would be a generic pill form of diabetes medicine.  I set up appt to discuss options with [facility].

## 2023-04-01 ENCOUNTER — Ambulatory Visit (INDEPENDENT_AMBULATORY_CARE_PROVIDER_SITE_OTHER): Payer: 59 | Admitting: Medical

## 2023-04-01 ENCOUNTER — Encounter: Payer: Self-pay | Admitting: Medical

## 2023-04-01 VITALS — BP 130/80 | HR 74 | Ht 65.75 in | Wt 301.2 lb

## 2023-04-01 DIAGNOSIS — E785 Hyperlipidemia, unspecified: Secondary | ICD-10-CM | POA: Diagnosis not present

## 2023-04-01 DIAGNOSIS — I10 Essential (primary) hypertension: Secondary | ICD-10-CM | POA: Diagnosis not present

## 2023-04-01 DIAGNOSIS — E119 Type 2 diabetes mellitus without complications: Secondary | ICD-10-CM | POA: Diagnosis not present

## 2023-04-01 DIAGNOSIS — Z7185 Encounter for immunization safety counseling: Secondary | ICD-10-CM

## 2023-04-01 DIAGNOSIS — Z23 Encounter for immunization: Secondary | ICD-10-CM | POA: Diagnosis not present

## 2023-04-01 LAB — POCT GLYCOSYLATED HEMOGLOBIN (HGB A1C): Hemoglobin A1C: 6.5 % — AB (ref 4.0–5.6)

## 2023-04-01 NOTE — Progress Notes (Signed)
I have faxed them notes to pharmquest and they will get back to [place] if patient qualifies

## 2023-04-01 NOTE — Progress Notes (Signed)
Subjective:  Tammie Sims is a 52 y.o. female who presents for Chief Complaint  Patient presents with   Consult    Would like to have A1c rechecked and discuss GLP1 medication. Ozempic was too expensive $463 for one month, never started.      Here for chronic disease follow-up, med check  Diabetes-last visit we were trying to pursue GLP-1 medication but she declined due to cost.  She would like an updated hemoglobin A1c test today.  Has a glucometer.  Checks sometimes.  Highest readings 141.  Has been trying to be more careful with diet.  More recently 98-112 glucose readings fasting.    Hypertension-compliant with olmesartan HCT 40/25 mg daily  Hyperlipidemia-statin was recommended last visit or at least CT coronary test to screen.  She has been making lifestyle changes  Weight fluctuates a few pounds up and down all the time. Possibly getting into perimenopausal state, having decreased periods over last few pounds.  Exercise - none currently.  Is a stress eater.  Lately is bringing her lunch to work instead of eating out fast food.    No other aggravating or relieving factors.    No other c/o.   Past Medical History:  Diagnosis Date   Abnormal Pap smear of cervix    many yrs ago   Allergy    Anemia    Diabetes mellitus without complication (HCC)    controlled by diet   History of stomach ulcers    age 18 or 41   Hypertension    STD (sexually transmitted disease) 1996   Hx HSV   Wears glasses    Current Outpatient Medications on File Prior to Visit  Medication Sig Dispense Refill   Cholecalciferol (VITAMIN D PO) Take by mouth.     Coenzyme Q10 (CO Q 10 PO) Take by mouth.     MAGNESIUM PO Take by mouth.     olmesartan-hydrochlorothiazide (BENICAR HCT) 40-25 MG tablet Take 1 tablet by mouth daily. 90 tablet 3   Omega-3 Fatty Acids (OMEGA 3 PO) Take by mouth.     omeprazole (PRILOSEC) 20 MG capsule Take 1 capsule (20 mg total) by mouth daily. 90 capsule 1   Probiotic  Product (PROBIOTIC PO) Take by mouth.     b complex vitamins tablet Take 1 tablet by mouth daily. (Patient not taking: Reported on 04/01/2023)     Cetirizine HCl (ZYRTEC PO) Take by mouth daily. (Patient not taking: Reported on 04/01/2023)     ibuprofen (ADVIL) 800 MG tablet Take 800 mg by mouth every 8 (eight) hours as needed. (Patient not taking: Reported on 04/01/2023)     No current facility-administered medications on file prior to visit.     The following portions of the patient's history were reviewed and updated as appropriate: allergies, current medications, past family history, past medical history, past social history, past surgical history and problem list.  ROS Otherwise as in subjective above    Objective: BP 130/80   Pulse 74   Ht 5' 5.75" (1.67 m)   Wt (!) 301 lb 3.2 oz (136.6 kg)   LMP 03/06/2023   BMI 48.99 kg/m   Wt Readings from Last 3 Encounters:  04/01/23 (!) 301 lb 3.2 oz (136.6 kg)  11/20/22 298 lb 3.2 oz (135.3 kg)  05/13/22 (!) 303 lb (137.4 kg)    General appearance: alert, no distress, well developed, well nourished Heart: RRR, normal S1, S2, no murmurs Lungs: CTA bilaterally, no wheezes, rhonchi, or  rales Pulses: 2+ radial pulses, 2+ pedal pulses, normal cap refill Ext: no edema    Assessment: Encounter Diagnoses  Name Primary?   Diabetes mellitus type 2, diet-controlled (HCC) Yes   Essential hypertension, benign    Hyperlipidemia, unspecified hyperlipidemia type    Obesity, Class III, BMI 40-49.9 (morbid obesity) (HCC)    Vaccine counseling    Need for Tdap vaccination      Plan: Diabetes - counseled on diet, exercise, continue glucose monitoring.  Last visit we were going to do ozempic but too expensive.  Referral for possible drug study today.    Obesity, BMI > 40 - counseled on diet, exercise, efforts to lose weight.  She has been working on lifestyle changes of late.   HTN - continue current medication  Hyperlipidemia -  discussed statins, cholesterol medication, CT coronary screen.  She declines medication and scan but wants to work on lifestyle changes first.   Vaccines: Declines flu shot  Counseled on shingrix.  She will consider.  Counseled on the Tdap (tetanus, diptheria, and acellular pertussis) vaccine.  Vaccine information sheet given. Tdap vaccine given after consent obtained.   Sees eye doctor in December.  Sees gyn in October and will be getting pap in October    Tammie Sims was seen today for consult.  Diagnoses and all orders for this visit:  Diabetes mellitus type 2, diet-controlled (HCC) -     HgB A1c  Essential hypertension, benign  Hyperlipidemia, unspecified hyperlipidemia type  Obesity, Class III, BMI 40-49.9 (morbid obesity) (HCC)  Vaccine counseling  Need for Tdap vaccination -     Tdap vaccine greater than or equal to 7yo IM    Follow up: 91mo

## 2023-06-09 ENCOUNTER — Other Ambulatory Visit: Payer: Self-pay | Admitting: Medical

## 2023-06-09 DIAGNOSIS — Z1231 Encounter for screening mammogram for malignant neoplasm of breast: Secondary | ICD-10-CM

## 2023-07-07 ENCOUNTER — Ambulatory Visit
Admission: RE | Admit: 2023-07-07 | Discharge: 2023-07-07 | Disposition: A | Discharge status: Home / Self Care | Payer: 59 | Source: Ambulatory Visit

## 2023-07-07 DIAGNOSIS — Z1231 Encounter for screening mammogram for malignant neoplasm of breast: Secondary | ICD-10-CM

## 2023-07-10 NOTE — Progress Notes (Signed)
Results sent through MyChart

## 2023-07-29 ENCOUNTER — Other Ambulatory Visit (HOSPITAL_COMMUNITY): Payer: Self-pay

## 2023-07-29 ENCOUNTER — Telehealth: Payer: Self-pay

## 2023-07-29 NOTE — Telephone Encounter (Signed)
 Hi Asha,  I see the patient has inquired about Ozempic . It has now been Approved.   Pharmacy Patient Advocate Encounter   Received notification from Physician's Office that prior authorization for Ozempic  is required/requested.   Insurance verification completed.   The patient is insured through [employer] .   Per test claim: The Rx has been filled as of 07/27/2023 and is payable again on/after 1.26.25

## 2023-07-31 ENCOUNTER — Other Ambulatory Visit: Payer: Self-pay | Admitting: Medical

## 2023-07-31 MED ORDER — OZEMPIC (0.25 OR 0.5 MG/DOSE) 2 MG/1.5ML ~~LOC~~ SOPN: 0.2500 mg | PEN_INJECTOR | SUBCUTANEOUS | 0 refills | Status: DC

## 2023-07-31 MED ORDER — OZEMPIC (0.25 OR 0.5 MG/DOSE) 2 MG/3ML ~~LOC~~ SOPN: 0.5000 mg | PEN_INJECTOR | SUBCUTANEOUS | 1 refills | Status: DC

## 2023-09-04 ENCOUNTER — Other Ambulatory Visit: Payer: Self-pay | Admitting: Medical

## 2023-09-04 MED ORDER — OZEMPIC (0.25 OR 0.5 MG/DOSE) 2 MG/3ML ~~LOC~~ SOPN: 0.5000 mg | PEN_INJECTOR | SUBCUTANEOUS | 0 refills | Status: DC

## 2023-10-14 ENCOUNTER — Ambulatory Visit (INDEPENDENT_AMBULATORY_CARE_PROVIDER_SITE_OTHER): Admitting: Medical

## 2023-10-14 ENCOUNTER — Encounter: Payer: Self-pay | Admitting: Medical

## 2023-10-14 VITALS — BP 130/80 | HR 76 | Ht 66.0 in | Wt 292.4 lb

## 2023-10-14 DIAGNOSIS — I10 Essential (primary) hypertension: Secondary | ICD-10-CM

## 2023-10-14 DIAGNOSIS — J301 Allergic rhinitis due to pollen: Secondary | ICD-10-CM

## 2023-10-14 DIAGNOSIS — H1013 Acute atopic conjunctivitis, bilateral: Secondary | ICD-10-CM

## 2023-10-14 DIAGNOSIS — E119 Type 2 diabetes mellitus without complications: Secondary | ICD-10-CM

## 2023-10-14 DIAGNOSIS — K219 Gastro-esophageal reflux disease without esophagitis: Secondary | ICD-10-CM

## 2023-10-14 MED ORDER — OMEPRAZOLE 20 MG PO CPDR: 20.0000 mg | DELAYED_RELEASE_CAPSULE | Freq: Every day | ORAL | 1 refills | Status: DC

## 2023-10-14 MED ORDER — OLOPATADINE HCL 0.2 % OP SOLN: 1.0000 [drp] | Freq: Every day | OPHTHALMIC | 2 refills | Status: AC

## 2023-10-14 MED ORDER — ONDANSETRON 4 MG PO TBDP: 4.0000 mg | ORAL_TABLET | Freq: Three times a day (TID) | ORAL | 0 refills | Status: AC | PRN

## 2023-10-14 MED ORDER — SEMAGLUTIDE (2 MG/DOSE) 8 MG/3ML ~~LOC~~ SOPN: 2.0000 mg | PEN_INJECTOR | SUBCUTANEOUS | 0 refills | Status: DC

## 2023-10-14 MED ORDER — SEMAGLUTIDE (1 MG/DOSE) 4 MG/3ML ~~LOC~~ SOPN
1.0000 mg | PEN_INJECTOR | SUBCUTANEOUS | 0 refills | Status: DC
Start: 2023-10-14 — End: 2024-05-31

## 2023-10-14 NOTE — Progress Notes (Signed)
 Subjective:  Tammie Sims is a 53 y.o. female who presents for Chief Complaint  Patient presents with   Follow-up    Follow-up on ozempic, eye exam- [facility] [name]. Declines pneumonia and shingles      Here for diabetes f/u.    Currently on ozempic 0.5mg  weekly.  Getting some nausea, but tolerating ok.    Exercise - not a lot currently.  Diet - appetite is suppressed on ozempic.   Is doing better with diet since starting ozempic in the fall 2024.  Has been using smaller portions.  HTN - compliant with Olmesartan HCT.  Needs refill on GERD medicaiton as well.  Having some allergies, some itchy eyes, runny nose.  No other aggravating or relieving factors.    No other c/o.  Past Medical History:  Diagnosis Date   Abnormal Pap smear of cervix    many yrs ago   Allergy    Anemia    Diabetes mellitus without complication (HCC)    History of stomach ulcers    age 67 or 74   Hypertension    STD (sexually transmitted disease) 1996   Hx HSV   Wears glasses    Current Outpatient Medications on File Prior to Visit  Medication Sig Dispense Refill   b complex vitamins tablet Take 1 tablet by mouth daily.     Cetirizine HCl (ZYRTEC PO) Take by mouth daily.     Cholecalciferol (VITAMIN D PO) Take by mouth.     Coenzyme Q10 (CO Q 10 PO) Take by mouth.     ibuprofen (ADVIL) 800 MG tablet Take 800 mg by mouth every 8 (eight) hours as needed.     MAGNESIUM PO Take by mouth.     olmesartan-hydrochlorothiazide (BENICAR HCT) 40-25 MG tablet Take 1 tablet by mouth daily. 90 tablet 3   Omega-3 Fatty Acids (OMEGA 3 PO) Take by mouth.     Probiotic Product (PROBIOTIC PO) Take by mouth.     No current facility-administered medications on file prior to visit.     The following portions of the patient's history were reviewed and updated as appropriate: allergies, current medications, past family history, past medical history, past social history, past surgical history and problem  list.  ROS Otherwise as in subjective above    Objective: BP 130/80   Pulse 76   Ht 5\' 6"  (1.676 m)   Wt 292 lb 6.4 oz (132.6 kg)   LMP 07/02/2023   BMI 47.19 kg/m   Wt Readings from Last 3 Encounters:  10/14/23 292 lb 6.4 oz (132.6 kg)  04/01/23 (!) 301 lb 3.2 oz (136.6 kg)  11/20/22 298 lb 3.2 oz (135.3 kg)    General appearance: alert, no distress, well developed, well nourished HEENT: normocephalic, sclerae anicteric, conjunctiva pink and moist, nares patent, no discharge or erythema, pharynx normal Oral cavity: MMM, no lesions Neck: supple, no lymphadenopathy, no thyromegaly, no masses Heart: RRR, normal S1, S2, no murmurs Lungs: CTA bilaterally, no wheezes, rhonchi, or rales Pulses: 2+ radial pulses, 2+ pedal pulses, normal cap refill Ext: no edema  Diabetic Foot Exam - Simple   Simple Foot Form Diabetic Foot exam was performed with the following findings: Yes 10/14/2023 10:39 AM  Visual Inspection See comments: Yes Sensation Testing Intact to touch and monofilament testing bilaterally: Yes Pulse Check Posterior Tibialis and Dorsalis pulse intact bilaterally: Yes Comments Flat feet      Assessment: Encounter Diagnoses  Name Primary?   Diabetes mellitus type 2, diet-controlled (  HCC) Yes   Gastroesophageal reflux disease without esophagitis    Essential hypertension, benign    Allergic rhinitis due to pollen, unspecified seasonality    Allergic conjunctivitis of both eyes      Plan: Diabetes Increase to Ozempic 1 mg when you finish out the current dose.  Then increase to 2 mg dose Work on getting exercise regularly.  Continue healthy diet We discussed risks and benefits of Ozempic.  Can use zofran as needed  GERD - c/t omeprazole, avoid triggers  Allergic conjunctiva - begin trial of pataday.    Allergies - continue nasal saline flush daily, zyrtec daily.  HTN - continue Olmesartan HCT 40/25mg  daily  [name] was seen today for  follow-up.  Diagnoses and all orders for this visit:  Diabetes mellitus type 2, diet-controlled (HCC)  Gastroesophageal reflux disease without esophagitis -     omeprazole (PRILOSEC) 20 MG capsule; Take 1 capsule (20 mg total) by mouth daily.  Essential hypertension, benign  Allergic rhinitis due to pollen, unspecified seasonality  Allergic conjunctivitis of both eyes  Other orders -     ondansetron (ZOFRAN-ODT) 4 MG disintegrating tablet; Take 1 tablet (4 mg total) by mouth every 8 (eight) hours as needed for nausea or vomiting. -     Semaglutide, 1 MG/DOSE, 4 MG/3ML SOPN; Inject 1 mg as directed once a week. -     Semaglutide, 2 MG/DOSE, 8 MG/3ML SOPN; Inject 2 mg as directed once a week. -     Olopatadine HCl 0.2 % SOLN; Apply 1 drop to eye daily.   Follow up: May 2025 for physical and fasting labs

## 2023-10-29 ENCOUNTER — Other Ambulatory Visit: Payer: Self-pay | Admitting: Medical

## 2023-10-29 MED ORDER — AMOXICILLIN 875 MG PO TABS: 875.0000 mg | ORAL_TABLET | Freq: Two times a day (BID) | ORAL | 0 refills | Status: DC

## 2023-11-12 ENCOUNTER — Telehealth: Payer: Self-pay | Admitting: Internal Medicine

## 2023-11-12 ENCOUNTER — Other Ambulatory Visit: Payer: Self-pay | Admitting: Medical

## 2023-11-12 DIAGNOSIS — E785 Hyperlipidemia, unspecified: Secondary | ICD-10-CM

## 2023-11-12 DIAGNOSIS — Z0000 Encounter for general adult medical examination without abnormal findings: Secondary | ICD-10-CM

## 2023-11-12 DIAGNOSIS — I10 Essential (primary) hypertension: Secondary | ICD-10-CM

## 2023-11-12 DIAGNOSIS — E119 Type 2 diabetes mellitus without complications: Secondary | ICD-10-CM

## 2023-11-12 DIAGNOSIS — E559 Vitamin D deficiency, unspecified: Secondary | ICD-10-CM | POA: Insufficient documentation

## 2023-11-12 NOTE — Telephone Encounter (Signed)
 Patient has appt next week for labs. No future orders. Please place

## 2023-11-13 NOTE — Telephone Encounter (Signed)
 Labs ordered.

## 2023-11-20 ENCOUNTER — Other Ambulatory Visit

## 2023-11-20 DIAGNOSIS — Z0000 Encounter for general adult medical examination without abnormal findings: Secondary | ICD-10-CM

## 2023-11-20 DIAGNOSIS — E119 Type 2 diabetes mellitus without complications: Secondary | ICD-10-CM

## 2023-11-20 DIAGNOSIS — E785 Hyperlipidemia, unspecified: Secondary | ICD-10-CM

## 2023-11-20 DIAGNOSIS — E559 Vitamin D deficiency, unspecified: Secondary | ICD-10-CM

## 2023-11-20 LAB — LIPID PANEL

## 2023-11-21 LAB — CBC
Hematocrit: 36.9 % (ref 34.0–46.6)
Hemoglobin: 11.3 g/dL (ref 11.1–15.9)
MCH: 22.9 pg — ABNORMAL LOW (ref 26.6–33.0)
MCHC: 30.6 g/dL — ABNORMAL LOW (ref 31.5–35.7)
MCV: 75 fL — ABNORMAL LOW (ref 79–97)
Platelets: 270 10*3/uL (ref 150–450)
RBC: 4.93 x10E6/uL (ref 3.77–5.28)
RDW: 15.2 % (ref 11.7–15.4)
WBC: 6.9 10*3/uL (ref 3.4–10.8)

## 2023-11-21 LAB — COMPREHENSIVE METABOLIC PANEL WITH GFR
ALT: 18 IU/L (ref 0–32)
AST: 21 IU/L (ref 0–40)
Albumin: 4.3 g/dL (ref 3.8–4.9)
Alkaline Phosphatase: 119 IU/L (ref 44–121)
BUN/Creatinine Ratio: 10 (ref 9–23)
BUN: 10 mg/dL (ref 6–24)
Bilirubin Total: 0.3 mg/dL (ref 0.0–1.2)
CO2: 23 mmol/L (ref 20–29)
Calcium: 9.6 mg/dL (ref 8.7–10.2)
Chloride: 102 mmol/L (ref 96–106)
Creatinine, Ser: 1.01 mg/dL — ABNORMAL HIGH (ref 0.57–1.00)
Globulin, Total: 3.1 g/dL (ref 1.5–4.5)
Glucose: 89 mg/dL (ref 70–99)
Potassium: 3.9 mmol/L (ref 3.5–5.2)
Sodium: 141 mmol/L (ref 134–144)
Total Protein: 7.4 g/dL (ref 6.0–8.5)
eGFR: 67 mL/min/{1.73_m2} (ref >59)

## 2023-11-21 LAB — LIPID PANEL
Chol/HDL Ratio: 2.7 ratio (ref 0.0–4.4)
Cholesterol, Total: 184 mg/dL (ref 100–199)
HDL: 68 mg/dL (ref >39)
LDL Chol Calc (NIH): 101 mg/dL — ABNORMAL HIGH (ref 0–99)
Triglycerides: 83 mg/dL (ref 0–149)
VLDL Cholesterol Cal: 15 mg/dL (ref 5–40)

## 2023-11-21 LAB — HEMOGLOBIN A1C
Est. average glucose Bld gHb Est-mCnc: 117 mg/dL
Hgb A1c MFr Bld: 5.7 % — ABNORMAL HIGH (ref 4.8–5.6)

## 2023-11-21 LAB — VITAMIN D 25 HYDROXY (VIT D DEFICIENCY, FRACTURES): Vit D, 25-Hydroxy: 60.7 ng/mL (ref 30.0–100.0)

## 2023-11-21 NOTE — Progress Notes (Signed)
 Her physical visit is next week.  Diabetes marker 5.7%, cholesterol needs to have LDL less than 70 so not quite to goal, blood counts show low indices MCV, kidney marker slightly elevated, liver electrolytes and blood sugar okay, vitamin D  normal  We will need to discuss cholesterol therapy consider additional labs given the indices on the blood count

## 2023-11-24 ENCOUNTER — Ambulatory Visit (INDEPENDENT_AMBULATORY_CARE_PROVIDER_SITE_OTHER): Admitting: Medical

## 2023-11-24 ENCOUNTER — Encounter: Payer: Self-pay | Admitting: Medical

## 2023-11-24 VITALS — BP 122/80 | HR 73 | Ht 66.0 in | Wt 283.2 lb

## 2023-11-24 DIAGNOSIS — I10 Essential (primary) hypertension: Secondary | ICD-10-CM

## 2023-11-24 DIAGNOSIS — E119 Type 2 diabetes mellitus without complications: Secondary | ICD-10-CM | POA: Diagnosis not present

## 2023-11-24 DIAGNOSIS — E785 Hyperlipidemia, unspecified: Secondary | ICD-10-CM | POA: Diagnosis not present

## 2023-11-24 DIAGNOSIS — E66813 Obesity, class 3: Secondary | ICD-10-CM

## 2023-11-24 DIAGNOSIS — Z0000 Encounter for general adult medical examination without abnormal findings: Secondary | ICD-10-CM

## 2023-11-24 MED ORDER — SEMAGLUTIDE (1 MG/DOSE) 4 MG/3ML ~~LOC~~ SOPN: 1.0000 mg | PEN_INJECTOR | SUBCUTANEOUS | 0 refills | Status: DC

## 2023-11-24 MED ORDER — OLMESARTAN MEDOXOMIL-HCTZ 40-25 MG PO TABS: 1.0000 | ORAL_TABLET | Freq: Every day | ORAL | 3 refills | Status: AC

## 2023-11-24 NOTE — Progress Notes (Signed)
 Subjective:   HPI  Tammie Sims is a 53 y.o. female who presents for Chief Complaint  Patient presents with   Annual Exam    Nonfasting cpe, had labs done 11/20/23, eye exam done- [facility], sees obgyn    Patient Care Team: Davell Beckstead, Christiane Cowing, PA-C as PCP - General (Family Medicine) Andee Bamberger, NP as Nurse Practitioner (Gynecology) Dr. Lanita Pitman, GI   Concerns: Here for routine physical today.  She sees gynecology but due for Pap smear.  Last Pap 2020  She has been using 1 mg of Ozempic  and is supposed to get the 2 mg Ozempic  but has lately had some diarrhea.  She wonders if this will get worse on the 10 mg.  She has had indiscretion with diet of late  She does have a history of statin use for period of time with prior PCP, ached really bad.  Was initially on atorvastatin, and after side effects was prescribed lower dose but she never took the lower dose.   Reviewed their medical, surgical, family, social, medication, and allergy history and updated chart as appropriate.  Allergies  Allergen Reactions   Biaxin [Clarithromycin] Nausea And Vomiting    Past Medical History:  Diagnosis Date   Abnormal Pap smear of cervix    many yrs ago   Allergy    Anemia    Diabetes mellitus without complication (HCC)    History of stomach ulcers    age 54 or 22   Hypertension    STD (sexually transmitted disease) 1996   Hx HSV   Wears glasses     Current Outpatient Medications on File Prior to Visit  Medication Sig Dispense Refill   b complex vitamins tablet Take 1 tablet by mouth daily.     Cetirizine HCl (ZYRTEC PO) Take by mouth daily.     Cholecalciferol (VITAMIN D  PO) Take by mouth.     Coenzyme Q10 (CO Q 10 PO) Take by mouth.     fluticasone (FLONASE) 50 MCG/ACT nasal spray Place 2 sprays into the nose daily.     MAGNESIUM PO Take by mouth.     Olopatadine  HCl 0.2 % SOLN Apply 1 drop to eye daily. 2.5 mL 2   Omega-3 Fatty Acids (OMEGA 3 PO) Take  by mouth.     omeprazole  (PRILOSEC) 20 MG capsule Take 1 capsule (20 mg total) by mouth daily. 90 capsule 1   ondansetron  (ZOFRAN -ODT) 4 MG disintegrating tablet Take 1 tablet (4 mg total) by mouth every 8 (eight) hours as needed for nausea or vomiting. 20 tablet 0   Semaglutide , 1 MG/DOSE, 4 MG/3ML SOPN Inject 1 mg as directed once a week. 3 mL 0   ibuprofen (ADVIL) 800 MG tablet Take 800 mg by mouth every 8 (eight) hours as needed. (Patient not taking: Reported on 11/24/2023)     Semaglutide , 2 MG/DOSE, 8 MG/3ML SOPN Inject 2 mg as directed once a week. (Patient not taking: Reported on 11/24/2023) 3 mL 0   No current facility-administered medications on file prior to visit.      Current Outpatient Medications:    b complex vitamins tablet, Take 1 tablet by mouth daily., Disp: , Rfl:    Cetirizine HCl (ZYRTEC PO), Take by mouth daily., Disp: , Rfl:    Cholecalciferol (VITAMIN D  PO), Take by mouth., Disp: , Rfl:    Coenzyme Q10 (CO Q 10 PO), Take by mouth., Disp: , Rfl:    fluticasone (FLONASE) 50 MCG/ACT nasal spray,  Place 2 sprays into the nose daily., Disp: , Rfl:    MAGNESIUM PO, Take by mouth., Disp: , Rfl:    Olopatadine  HCl 0.2 % SOLN, Apply 1 drop to eye daily., Disp: 2.5 mL, Rfl: 2   Omega-3 Fatty Acids (OMEGA 3 PO), Take by mouth., Disp: , Rfl:    omeprazole  (PRILOSEC) 20 MG capsule, Take 1 capsule (20 mg total) by mouth daily., Disp: 90 capsule, Rfl: 1   ondansetron  (ZOFRAN -ODT) 4 MG disintegrating tablet, Take 1 tablet (4 mg total) by mouth every 8 (eight) hours as needed for nausea or vomiting., Disp: 20 tablet, Rfl: 0   Semaglutide , 1 MG/DOSE, 4 MG/3ML SOPN, Inject 1 mg as directed once a week., Disp: 3 mL, Rfl: 0   Semaglutide , 1 MG/DOSE, 4 MG/3ML SOPN, Inject 1 mg as directed once a week., Disp: 3 mL, Rfl: 0   ibuprofen (ADVIL) 800 MG tablet, Take 800 mg by mouth every 8 (eight) hours as needed. (Patient not taking: Reported on 11/24/2023), Disp: , Rfl:     olmesartan -hydrochlorothiazide (BENICAR  HCT) 40-25 MG tablet, Take 1 tablet by mouth daily., Disp: 90 tablet, Rfl: 3   Semaglutide , 2 MG/DOSE, 8 MG/3ML SOPN, Inject 2 mg as directed once a week. (Patient not taking: Reported on 11/24/2023), Disp: 3 mL, Rfl: 0  Family History  Problem Relation Age of Onset   Hypertension Father    Cancer Maternal Grandmother        breast   Diabetes Maternal Grandmother    Hypertension Maternal Grandmother    Breast cancer Maternal Grandmother 80       mastecomy with recurence to brain at 88   Heart disease Maternal Grandfather    Diabetes Maternal Grandfather    Hypertension Maternal Grandfather    Stroke Neg Hx     Past Surgical History:  Procedure Laterality Date   CESAREAN SECTION  1997   COLONOSCOPY  11/2020   [facility]   CRYOTHERAPY     many yrs ago for abnormal pap smear   GYNECOLOGIC CRYOSURGERY  1996   abnormal pap      Review of Systems  Constitutional:  Negative for chills, fever, malaise/fatigue and weight loss.  HENT:  Negative for congestion, ear pain, hearing loss, sore throat and tinnitus.   Eyes:  Negative for blurred vision, pain and redness.  Respiratory:  Negative for cough, hemoptysis and shortness of breath.   Cardiovascular:  Negative for chest pain, palpitations, orthopnea, claudication and leg swelling.  Gastrointestinal:  Negative for abdominal pain, blood in stool, constipation, diarrhea, nausea and vomiting.  Genitourinary:  Negative for dysuria, flank pain, frequency, hematuria and urgency.  Musculoskeletal:  Negative for falls, joint pain and myalgias.  Skin:  Negative for itching and rash.  Neurological:  Negative for dizziness, tingling, speech change, weakness and headaches.  Endo/Heme/Allergies:  Negative for polydipsia. Does not bruise/bleed easily.  Psychiatric/Behavioral:  Negative for depression and memory loss. The patient is not nervous/anxious and does not have insomnia.         Objective:  BP  122/80   Pulse 73   Ht 5\' 6"  (1.676 m)   Wt 283 lb 3.2 oz (128.5 kg)   SpO2 97%   BMI 45.71 kg/m   General appearance: alert, no distress, WD/WN, African American female Skin: Unremarkable HEENT: normocephalic, conjunctiva/corneas normal, sclerae anicteric, PERRLA, EOMi, nares patent, no discharge or erythema, pharynx normal Oral cavity: MMM, tongue normal, teeth normal Neck: supple, no lymphadenopathy, no thyromegaly, no masses, normal ROM,  no bruits Chest: non tender, normal shape and expansion Heart: RRR, normal S1, S2, no murmurs Lungs: CTA bilaterally, no wheezes, rhonchi, or rales Abdomen: +bs, soft, non tender, non distended, no masses, no hepatomegaly, no splenomegaly, no bruits Back: non tender, normal ROM, no scoliosis Musculoskeletal: upper extremities non tender, no obvious deformity, normal ROM throughout, lower extremities non tender, no obvious deformity, normal ROM throughout Extremities: no edema, no cyanosis, no clubbing Pulses: 2+ symmetric, upper and lower extremities, normal cap refill Neurological: alert, oriented x 3, CN2-12 intact, strength normal upper extremities and lower extremities, sensation normal throughout, DTRs 2+ throughout, no cerebellar signs, gait normal Psychiatric: normal affect, behavior normal, pleasant  Breast/gyn/rectal - deferred to gynecology     Assessment and Plan :   Encounter Diagnoses  Name Primary?   Encounter for health maintenance examination in adult Yes   Diabetes mellitus type 2, diet-controlled (HCC)    Hypertension, unspecified type    Hyperlipidemia, unspecified hyperlipidemia type    Obesity, Class III, BMI 40-49.9 (morbid obesity)    Morbid obesity (HCC)      This visit was a preventative care visit, also known as wellness visit or routine physical.   Topics typically include healthy lifestyle, diet, exercise, preventative care, vaccinations, sick and well care, proper use of emergency dept and after hours care, as  well as other concerns.    Separate significant issues discussed: Hypertension-continue olmesartan  HCT 40/25.  Monitor blood pressures at home in case she start seeing low readings given her weight loss efforts  Obesity, morbid obesity, BMI greater than 40-continue efforts to lose weight.  Healthier choices and aerobic exercise, we will continue 1 mg semaglutide  and try to go up to 2 mg in the next few weeks assuming no worsening diarrhea or side effects  Hyperlipidemia-counseled on prior labs.  Consider medication.  She declines currently   General Recommendations: Continue to return yearly for your annual wellness and preventative care visits.  This gives us  a chance to discuss healthy lifestyle, exercise, vaccinations, review your chart record, and perform screenings where appropriate.  I recommend you see your eye doctor yearly for routine vision care.  I recommend you see your dentist yearly for routine dental care including hygiene visits twice yearly.  Continue routine follow-up with gynecology   Vaccination recommendations were reviewed Immunization History  Administered Date(s) Administered   DTaP 07/22/2012   Influenza-Unspecified 06/27/2021   PFIZER(Purple Top)SARS-COV-2 Vaccination 11/13/2019, 12/04/2019, 07/24/2020   Tdap 12/23/2012, 04/01/2023    Vaccine recommendations: Shingles, pneumococcal, yearly flu shot  Vaccines administered today: Declines vaccines today   Screening for cancer: Colon cancer screening: Prior or last colon cancer screen: 2022.  Due repeat in 2032   Breast cancer screening: You should perform a self breast exam monthly.   We reviewed recommendations for regular mammograms and breast cancer screening. Last mammogram: 06/2023  Cervical cancer screening: We reviewed recommendations for pap smear screening. Last pap 2020, due now   Skin cancer screening: Check your skin regularly for new changes, growing lesions, or other lesions of  concern Come in for evaluation if you have skin lesions of concern.  Lung cancer screening: If you have a greater than 20 pack year history of tobacco use, then you may qualify for lung cancer screening with a chest CT scan.   Please call your insurance company to inquire about coverage for this test.  Pancreatic cancer: no current screening test is available routinely recommended.  (Risk factors: Smoking, overweight or obese, diabetes,  chronic pancreatitis, work Nurse, mental health, Solicitor, 91 year old or greater, female greater than female, African-American, family history of pancreatic cancer, hereditary breast, ovarian, melanoma, Lynch, Peutz-jeghers).  We currently don't have screenings for other cancers besides breast, cervical, colon, and lung cancers.  If you have a strong family history of cancer or have other cancer screening concerns, please let me know.    Bone health: Get at least 150 minutes of aerobic exercise weekly Get weight bearing exercise at least once weekly Bone density test:  A bone density test is an imaging test that uses a type of X-ray to measure the amount of calcium and other minerals in your bones. The test may be used to diagnose or screen you for a condition that causes weak or thin bones (osteoporosis), predict your risk for a broken bone (fracture), or determine how well your osteoporosis treatment is working. The bone density test is recommended for females 65 and older, or females or males <65 if certain risk factors such as thyroid disease, long term use of steroids such as for asthma or rheumatological issues, vitamin D  deficiency, estrogen deficiency, family history of osteoporosis, self or family history of fragility fracture in first degree relative.    Heart health: Get at least 150 minutes of aerobic exercise weekly Limit alcohol It is important to maintain a healthy blood pressure and healthy cholesterol numbers  Heart disease  screening: Screening for heart disease includes screening for blood pressure, fasting lipids, glucose/diabetes screening, BMI height to weight ratio, reviewed of smoking status, physical activity, and diet.    Goals include blood pressure 120/80 or less, maintaining a healthy lipid/cholesterol profile, preventing diabetes or keeping diabetes numbers under good control, not smoking or using tobacco products, exercising most days per week or at least 150 minutes per week of exercise, and eating healthy variety of fruits and vegetables, healthy oils, and avoiding unhealthy food choices like fried food, fast food, high sugar and high cholesterol foods.    Other tests may possibly include EKG test, CT coronary calcium score, echocardiogram, exercise treadmill stress test.    Vascular disease screening: For high risk individuals including smokers, diabetes, patients with known heart disease or high blood pressure, kidney disease, and others, screening for vascular disease or atherosclerosis of the arteries is available.  Examples may include carotid ultrasound, abdominal aortic ultrasound, ABI blood flow screening in the legs, thoracic aorta screening.   Medical care options: I recommend you continue to seek care here first for routine care.  We try really hard to have available appointments Monday through Friday daytime hours for sick visits, acute visits, and physicals.  Urgent care should be used for after hours and weekends for significant issues that cannot wait till the next day.  The emergency department should be used for significant potentially life-threatening emergencies.  The emergency department is expensive, can often have long wait times for less significant concerns, so try to utilize primary care, urgent care, or telemedicine when possible to avoid unnecessary trips to the emergency department.  Virtual visits and telemedicine have been introduced since the pandemic started in 2020, and can  be convenient ways to receive medical care.  We offer virtual appointments as well to assist you in a variety of options to seek medical care.   Legal Take the time to do a Last Will and Testament, advanced directives including Healthcare Power of Attorney and Living Will documents.  Do not leave your family with burdens that can be handled ahead of time.  Advanced Directives: I recommend you consider completing a Health Care Power of Attorney and Living Will.   These documents respect your wishes and help alleviate burdens on your loved ones if you were to become terminally ill or be in a position to need those documents enforced.    You can complete Advanced Directives yourself, have them notarized, then have copies made for our office, for you and for anybody you feel should have them in safe keeping.  Or, you can have an attorney prepare these documents.   If you haven't updated your Last Will and Testament in a while, it may be worthwhile having an attorney prepare these documents together and save on some costs.       Spiritual and Emotional Health Keeping a healthy spiritual life can help you better manage your physical health. Your spiritual life can help you to cope with any issues that may arise with your physical health.  Balance can keep us  healthy and help us  to recover.  If you are struggling with your spiritual health there are questions that you may want to ask yourself:  What makes me feel most complete? When do I feel most connected to the rest of the world? Where do I find the most inner strength? What am I doing when I feel whole?  Helpful tips: Being in nature. Some people feel very connected and at peace when they are walking outdoors or are outside. Helping others. Some feel the largest sense of wellbeing when they are of service to others. Being of service can take on many forms. It can be doing volunteer work, being kind to strangers, or offering a hand to a  friend in need. Gratitude. Some people find they feel the most connected when they remain grateful. They may make lists of all the things they are grateful for or say a thank you out loud for all they have.    Emotional Health Are you in tune with your emotional health?  Check out this link: http://www.marquez-love.com/   Financial Health Make sure you use a budget for your personal finances Make sure you are insured against risks (health insurance, life insurance, auto insurance, etc) Save more, spend less Set financial goals If you need help in this area, good resources include counseling through [employer] or other community resources, have a meeting with a Social research officer, government, and a good resource is Scientist, physiological    Irlene was seen today for annual exam.  Diagnoses and all orders for this visit:  Encounter for health maintenance examination in adult -     Microalbumin/Creatinine Ratio, Urine  Diabetes mellitus type 2, diet-controlled (HCC) -     Microalbumin/Creatinine Ratio, Urine  Hypertension, unspecified type -     olmesartan -hydrochlorothiazide (BENICAR  HCT) 40-25 MG tablet; Take 1 tablet by mouth daily.  Hyperlipidemia, unspecified hyperlipidemia type  Obesity, Class III, BMI 40-49.9 (morbid obesity)  Morbid obesity (HCC)  Other orders -     Semaglutide , 1 MG/DOSE, 4 MG/3ML SOPN; Inject 1 mg as directed once a week.    Follow-up  yearly for physical

## 2023-11-26 LAB — MICROALBUMIN / CREATININE URINE RATIO
Creatinine, Urine: 139.5 mg/dL
Microalb/Creat Ratio: <2 mg/g{creat} (ref 0–29)
Microalbumin, Urine: <3 ug/mL

## 2023-11-26 NOTE — Progress Notes (Signed)
 Results sent through MyChart

## 2023-12-23 ENCOUNTER — Other Ambulatory Visit: Payer: Self-pay | Admitting: Medical

## 2023-12-25 ENCOUNTER — Telehealth: Payer: Self-pay

## 2023-12-25 MED ORDER — SEMAGLUTIDE (1 MG/DOSE) 4 MG/3ML ~~LOC~~ SOPN: 1.0000 mg | PEN_INJECTOR | SUBCUTANEOUS | 0 refills | Status: DC

## 2023-12-25 NOTE — Telephone Encounter (Signed)
 Prescription auth request  Ozempic  1 mg [facility] @ [address] Tammie Sims Last appt 11/24/2023

## 2023-12-25 NOTE — Telephone Encounter (Signed)
 Tammie Sims approved this already

## 2023-12-29 ENCOUNTER — Other Ambulatory Visit: Payer: Self-pay | Admitting: Medical

## 2023-12-29 MED ORDER — IBUPROFEN 800 MG PO TABS: 800.0000 mg | ORAL_TABLET | Freq: Three times a day (TID) | ORAL | 0 refills | Status: AC | PRN

## 2024-01-26 ENCOUNTER — Telehealth: Payer: Self-pay

## 2024-01-26 ENCOUNTER — Ambulatory Visit (INDEPENDENT_AMBULATORY_CARE_PROVIDER_SITE_OTHER): Admitting: Medical

## 2024-01-26 VITALS — BP 126/80 | HR 72 | Ht 65.0 in | Wt 282.0 lb

## 2024-01-26 DIAGNOSIS — E119 Type 2 diabetes mellitus without complications: Secondary | ICD-10-CM | POA: Diagnosis not present

## 2024-01-26 DIAGNOSIS — E785 Hyperlipidemia, unspecified: Secondary | ICD-10-CM

## 2024-01-26 DIAGNOSIS — I10 Essential (primary) hypertension: Secondary | ICD-10-CM

## 2024-01-26 DIAGNOSIS — E66813 Obesity, class 3: Secondary | ICD-10-CM | POA: Diagnosis not present

## 2024-01-26 DIAGNOSIS — R718 Other abnormality of red blood cells: Secondary | ICD-10-CM

## 2024-01-26 DIAGNOSIS — R110 Nausea: Secondary | ICD-10-CM

## 2024-01-26 MED ORDER — SEMAGLUTIDE (2 MG/DOSE) 8 MG/3ML ~~LOC~~ SOPN
2.0000 mg | PEN_INJECTOR | SUBCUTANEOUS | 0 refills | Status: DC
Start: 2024-01-26 — End: 2024-05-31

## 2024-01-26 MED ORDER — SEMAGLUTIDE (2 MG/DOSE) 8 MG/3ML ~~LOC~~ SOPN: 2.0000 mg | PEN_INJECTOR | SUBCUTANEOUS | 0 refills | Status: DC

## 2024-01-26 MED ORDER — TIRZEPATIDE 10 MG/0.5ML ~~LOC~~ SOAJ: 10.0000 mg | SUBCUTANEOUS | 0 refills | Status: DC

## 2024-01-26 NOTE — Telephone Encounter (Signed)
 Prescription Auth Req: Ozempic  2mg /Dose [facility]

## 2024-01-26 NOTE — Telephone Encounter (Signed)
 refilled

## 2024-01-26 NOTE — Progress Notes (Signed)
 Subjective:  Tammie Sims is a 53 y.o. female who presents for Chief Complaint  Patient presents with   Medical Management of Chronic Issues    Med check- at 2mg  of ozempic . Possible switch to another medication     Here for med check.  I saw her in May for a physical.  At her last visit her cholesterol was not quite to goal.  She also had some abnormalities of her CBC.  She is taking some over-the-counter fish oil.  Hypertension-compliant with olmesartan  HCT 40/25 mg daily  Diabetes- 11/2023 she was on 1mg  ozempic  but was having some abdominal issues, nausea.   In June went up to Ozempic  2 mg weekly.  Been on 2mg  for all of June.   Last visit we discussed possible change to mounjaro .  Current exercise: Walking, weight training.  Dietary efforts: Careful with diet  Any side effects of medication ozempic : still some nausea.   No other aggravating or relieving factors.    No other c/o.  Past Medical History:  Diagnosis Date   Abnormal Pap smear of cervix    many yrs ago   Allergy    Anemia    Diabetes mellitus without complication (HCC)    History of stomach ulcers    age 11 or 38   Hypertension    STD (sexually transmitted disease) 1996   Hx HSV   Wears glasses     Current Outpatient Medications on File Prior to Visit  Medication Sig Dispense Refill   b complex vitamins tablet Take 1 tablet by mouth daily.     Cetirizine HCl (ZYRTEC PO) Take by mouth daily.     Cholecalciferol (VITAMIN D  PO) Take by mouth.     Coenzyme Q10 (CO Q 10 PO) Take by mouth.     fluticasone (FLONASE) 50 MCG/ACT nasal spray Place 2 sprays into the nose daily.     ibuprofen  (ADVIL ) 800 MG tablet Take 1 tablet (800 mg total) by mouth every 8 (eight) hours as needed. 90 tablet 0   MAGNESIUM PO Take by mouth.     olmesartan -hydrochlorothiazide (BENICAR  HCT) 40-25 MG tablet Take 1 tablet by mouth daily. 90 tablet 3   Olopatadine  HCl 0.2 % SOLN Apply 1 drop to eye daily. 2.5 mL 2   Omega-3  Fatty Acids (OMEGA 3 PO) Take by mouth.     omeprazole  (PRILOSEC) 20 MG capsule Take 1 capsule (20 mg total) by mouth daily. 90 capsule 1   ondansetron  (ZOFRAN -ODT) 4 MG disintegrating tablet Take 1 tablet (4 mg total) by mouth every 8 (eight) hours as needed for nausea or vomiting. 20 tablet 0   Semaglutide , 1 MG/DOSE, 4 MG/3ML SOPN Inject 1 mg as directed once a week. 3 mL 0   Semaglutide , 1 MG/DOSE, 4 MG/3ML SOPN Inject 1 mg as directed once a week. 9 mL 0   No current facility-administered medications on file prior to visit.    The following portions of the patient's history were reviewed and updated as appropriate: allergies, current medications, past family history, past medical history, past social history, past surgical history and problem list.  ROS Otherwise as in subjective above   Objective: BP 126/80   Pulse 72   Ht 5' 5 (1.651 m)   Wt 282 lb (127.9 kg)   BMI 46.93 kg/m   BP Readings from Last 3 Encounters:  01/26/24 126/80  11/24/23 122/80  10/14/23 130/80   Wt Readings from Last 3 Encounters:  01/26/24 282  lb (127.9 kg)  11/24/23 283 lb 3.2 oz (128.5 kg)  10/14/23 292 lb 6.4 oz (132.6 kg)    General appearance: alert, no distress, well developed, well nourished Otherwise not examined     Assessment: Encounter Diagnoses  Name Primary?   Diabetes mellitus type 2, diet-controlled (HCC) Yes   Essential hypertension, benign    Hyperlipidemia, unspecified hyperlipidemia type    Obesity, Class III, BMI 40-49.9 (morbid obesity)    Nausea    Abnormal RBC indices      Plan: Diabetes type 2 - changing from Ozepmic 2mg  today to Mounjaro  10mg .  We will plan to go to 12.5mg  in 1 month, then 15mg  in 2 months.  continue healthy eating habits and regular exercise.  She has glucometer.  Advised periodic monitoring such as once weekly.  Hyperlipidemia - discussed standard of care for statin for diabetics.  She wants to hold off on any additional medication for now  since we are making changes from Ozepmic to mounjaro   Obesity - counseled on diet, exercise, efforts to lose weight  HTN - continue Olmesartan  HCT 40/25mg  daily.   Tammie Sims was seen today for medical management of chronic issues.  Diagnoses and all orders for this visit:  Diabetes mellitus type 2, diet-controlled (HCC)  Essential hypertension, benign  Hyperlipidemia, unspecified hyperlipidemia type  Obesity, Class III, BMI 40-49.9 (morbid obesity)  Nausea  Abnormal RBC indices  Other orders -     tirzepatide  (MOUNJARO ) 10 MG/0.5ML Pen; Inject 10 mg into the skin once a week. -     Semaglutide , 2 MG/DOSE, 8 MG/3ML SOPN; Inject 2 mg as directed once a week.    Follow up: 3-4 mo, plan to repeat HgbA1C and CBC diff and iron at that time.

## 2024-01-27 ENCOUNTER — Other Ambulatory Visit: Payer: Self-pay | Admitting: Medical

## 2024-01-27 ENCOUNTER — Telehealth: Payer: Self-pay

## 2024-01-27 ENCOUNTER — Other Ambulatory Visit (HOSPITAL_COMMUNITY): Payer: Self-pay

## 2024-01-27 MED ORDER — TIRZEPATIDE 7.5 MG/0.5ML ~~LOC~~ SOAJ: 7.5000 mg | SUBCUTANEOUS | 0 refills | Status: DC

## 2024-01-27 NOTE — Telephone Encounter (Signed)
 Pharmacy Patient Advocate Encounter   Received notification from Pt Calls Messages that prior authorization for Mounjaro  7.5MG /0.5ML auto-injectors is required/requested.   Insurance verification completed.   The patient is insured through [facility] .   Per test claim: PA required; PA submitted to above mentioned insurance via CoverMyMeds Key/confirmation #/EOC (Key: [id number])     Status is pending

## 2024-01-28 ENCOUNTER — Other Ambulatory Visit (HOSPITAL_COMMUNITY): Payer: Self-pay

## 2024-01-28 NOTE — Telephone Encounter (Signed)
 P/a for Mounjaro  has been approved and effective until 7.8.28   Pharmacy Patient Advocate Encounter  Received notification from [facility] that Prior Authorization for Mounjaro  7.5MG /0.5ML auto-injectors has been APPROVED  to 7.8.28. Ran test claim, Copay is $0.00. This test claim was processed through [facility]- copay amounts may vary at other pharmacies due to pharmacy/plan contracts, or as the patient moves through the different stages of their insurance plan.   PA #/Case ID/Reference #: (Key: [id number])

## 2024-01-29 ENCOUNTER — Other Ambulatory Visit (HOSPITAL_COMMUNITY): Payer: Self-pay

## 2024-03-24 ENCOUNTER — Encounter: Payer: Self-pay | Admitting: Nurse Practitioner

## 2024-03-24 ENCOUNTER — Ambulatory Visit (INDEPENDENT_AMBULATORY_CARE_PROVIDER_SITE_OTHER): Admitting: Nurse Practitioner

## 2024-03-24 ENCOUNTER — Other Ambulatory Visit (HOSPITAL_COMMUNITY)
Admission: RE | Admit: 2024-03-24 | Discharge: 2024-03-24 | Disposition: A | Discharge status: Home / Self Care | Source: Ambulatory Visit | Attending: Nurse Practitioner | Admitting: Nurse Practitioner

## 2024-03-24 VITALS — BP 118/62 | HR 110 | Ht 66.25 in | Wt 272.0 lb

## 2024-03-24 DIAGNOSIS — Z01419 Encounter for gynecological examination (general) (routine) without abnormal findings: Secondary | ICD-10-CM | POA: Insufficient documentation

## 2024-03-24 DIAGNOSIS — N951 Menopausal and female climacteric states: Secondary | ICD-10-CM

## 2024-03-24 DIAGNOSIS — Z124 Encounter for screening for malignant neoplasm of cervix: Secondary | ICD-10-CM

## 2024-03-24 DIAGNOSIS — Z1331 Encounter for screening for depression: Secondary | ICD-10-CM

## 2024-03-24 DIAGNOSIS — Z30018 Encounter for initial prescription of other contraceptives: Secondary | ICD-10-CM | POA: Diagnosis not present

## 2024-03-24 MED ORDER — PHEXXI 1.8-1-0.4 % VA GEL: 1.0000 | Freq: Every day | VAGINAL | 1 refills | Status: AC

## 2024-03-24 MED ORDER — PHEXXI 1.8-1-0.4 % VA GEL: 1.0000 | Freq: Every day | VAGINAL | 1 refills | Status: DC

## 2024-03-24 NOTE — Progress Notes (Signed)
 Tammie Sims [birth year] [id number]   History:  53 y.o. [id number] presents for annual exam. Went from January to August without menses. Wondering if it is related to GLP-1 since she started this in January. Having mild perimenopausal symptoms to include difficulty sleeping, hot flashes, night sweats, vaginal dryness and mood changes. Interested in non-hormonal contraception. Sees holistic provider. 1996 cryosurgery, normal pap since. HTN, DM (diet controlled) managed by PCP.   Gynecologic History Patient's last menstrual period was 02/22/2024 (exact date). Period Duration (Days): 7 Period Pattern: Regular Menstrual Flow: Moderate Menstrual Control: Maxi pad Dysmenorrhea: (!) Mild Dysmenorrhea Symptoms: Headache Contraception/Family planning: none Sexually active: Yes  Health Maintenance Last Pap: 07/06/2019. Results were: Normal neg HPV Last mammogram: 07/07/2023. Results were: Normal Last colonoscopy: 06/2021. Results were: Normal, 10-year recall Last Dexa: Not indicated     03/24/2024   12:09 PM  Depression screen PHQ 2/9  Decreased Interest 0  Down, Depressed, Hopeless 0  PHQ - 2 Score 0     Past medical history, past surgical history, family history and social history were all reviewed and documented in the EPIC chart. Married. Works in Audiological scientist. 61 yo son.   ROS:  A ROS was performed and pertinent positives and negatives are included.  Exam:  Vitals:   03/24/24 1201  BP: 118/62  Pulse: (!) 110  SpO2: 99%  Weight: 272 lb (123.4 kg)  Height: 5' 6.25 (1.683 m)    Body mass index is 43.57 kg/m.  General appearance:  Normal Thyroid:  Symmetrical, normal in size, without palpable masses or nodularity. Respiratory  Auscultation:  Clear without wheezing or rhonchi Cardiovascular  Auscultation:  Regular rate, without rubs, murmurs or gallops  Edema/varicosities:  Not grossly evident Abdominal  Soft,nontender, without masses, guarding or rebound.  Liver/spleen:   No organomegaly noted  Hernia:  None appreciated  Skin  Inspection:  Grossly normal Breasts: Examined lying and sitting.   Right: Without masses, retractions, nipple discharge or axillary adenopathy.   Left: Without masses, retractions, nipple discharge or axillary adenopathy. Pelvic: External genitalia:  no lesions              Urethra:  normal appearing urethra with no masses, tenderness or lesions              Bartholins and Skenes: normal                 Vagina: normal appearing vagina with normal color and discharge, no lesions              Cervix: no lesions Bimanual Exam:  Uterus:  no masses or tenderness              Adnexa: no mass, fullness, tenderness              Rectovaginal: Deferred              Anus:  normal, no lesions  Tammie Sims, CMA present as chaperone.   Assessment/Plan:  53 y.o. [id number] for annual exam.   Well female exam with routine gynecological exam - Education provided on SBEs, importance of preventative screenings, current guidelines, high calcium diet, regular exercise, and multivitamin daily. Labs with PCP.   Encounter for initial prescription of other contraceptives - Plan: Lactic Ac-Citric Ac-Pot Bitart (PHEXXI ) 1.8-1-0.4 % GEL as needed. Insert within 1 hour of intercourse.   Perimenopause - Went from January to August without menses. She is wondering if it is related to GLP-1 since she started this in  January. Having mild perimenopausal symptoms. Reassured changes likely s/t perimenopause but weight loss can also affect cycles.   Cervical cancer screening - Plan: Cytology - PAP( Tammie Sims). Cryosurgery in her early 20s, normal paps since.   Screening for breast cancer - Normal mammogram history.  Continue annual screenings.  Normal breast exam today.  Screening for colon cancer - 06/2021 colonoscopy. Will repeat at 10-year interval per GI's recommendation.   Return in about 1 year (around 03/24/2025) for Annual.     Tammie DELENA Shutter DNP,  12:38 PM 03/24/2024

## 2024-03-25 ENCOUNTER — Ambulatory Visit: Payer: Self-pay | Admitting: Nurse Practitioner

## 2024-03-25 LAB — CYTOLOGY - PAP
Adequacy: ABSENT
Comment: NEGATIVE
Diagnosis: NEGATIVE
High risk HPV: NEGATIVE

## 2024-04-04 ENCOUNTER — Other Ambulatory Visit: Payer: Self-pay | Admitting: Medical

## 2024-04-04 DIAGNOSIS — K219 Gastro-esophageal reflux disease without esophagitis: Secondary | ICD-10-CM

## 2024-04-05 NOTE — Telephone Encounter (Signed)
 Appt end of October

## 2024-04-14 ENCOUNTER — Other Ambulatory Visit: Payer: Self-pay | Admitting: Medical

## 2024-04-14 MED ORDER — HYDROCORTISONE 2.5 % EX CREA: TOPICAL_CREAM | Freq: Two times a day (BID) | CUTANEOUS | 0 refills | Status: AC

## 2024-05-31 ENCOUNTER — Ambulatory Visit: Payer: Self-pay | Admitting: Medical

## 2024-05-31 VITALS — BP 122/70 | HR 75 | Wt 268.6 lb

## 2024-05-31 DIAGNOSIS — Z23 Encounter for immunization: Secondary | ICD-10-CM | POA: Diagnosis not present

## 2024-05-31 DIAGNOSIS — E785 Hyperlipidemia, unspecified: Secondary | ICD-10-CM | POA: Diagnosis not present

## 2024-05-31 DIAGNOSIS — E119 Type 2 diabetes mellitus without complications: Secondary | ICD-10-CM | POA: Diagnosis not present

## 2024-05-31 DIAGNOSIS — I10 Essential (primary) hypertension: Secondary | ICD-10-CM | POA: Diagnosis not present

## 2024-05-31 DIAGNOSIS — E66813 Obesity, class 3: Secondary | ICD-10-CM

## 2024-05-31 DIAGNOSIS — K5900 Constipation, unspecified: Secondary | ICD-10-CM

## 2024-05-31 LAB — POCT GLYCOSYLATED HEMOGLOBIN (HGB A1C): Hemoglobin A1C: 5.7 % — AB (ref 4.0–5.6)

## 2024-05-31 MED ORDER — TIRZEPATIDE 12.5 MG/0.5ML ~~LOC~~ SOAJ: 12.5000 mg | SUBCUTANEOUS | 2 refills | Status: AC

## 2024-05-31 NOTE — Progress Notes (Signed)
 Subjective:  Tammie Sims is a 53 y.o. female who presents for Chief Complaint  Patient presents with   Medical Management of Chronic Issues    Med check. No concerns     Here for med check.    Tammie Sims is a 53 year old female with hypertension and type 2 diabetes who presents for medication management and follow-up.  She experiences dizziness, initially thought to be related to blood sugar levels, but her blood sugar was normal at 89-90 mg/dL. She suspects the dizziness might be related to her blood pressure, as she had been taking olmesartan  HCT 40/25 mg every other day due to concerns about low blood pressure. She resumed daily dosing two weeks ago and has not experienced dizziness since.  She is on Mounjaro  10 mg weekly for diabetes management, with her last A1c at 5.7%, indicating good control. She has lost weight from 282 lbs to 268 lbs since July, attributing this to her current regimen. She does not regularly check her blood sugar at home.  She experiences constipation, which she associates with her medication regimen. She is managing this with dietary changes, including increased fiber intake from broccoli and other sources, but has not had a bowel movement in the past few days.  She previously had irritation at the injection site of Mounjaro , which she managed by adjusting the injection site location. Aside from constipation and previous injection site irritation, no other significant side effects from the medication.  She has not been on any cholesterol medication recently, as her cholesterol levels improved with weight loss. She plans to have her cholesterol rechecked in May.  She has not had an eye exam recently but plans to see an eye doctor in December. Her last blood work in May showed normal kidney function and electrolytes.  No other aggravating or relieving factors.    No other c/o.  Past Medical History:  Diagnosis Date   Abnormal Pap smear of cervix     many yrs ago   Allergy    Anemia    Diabetes mellitus without complication (HCC)    History of stomach ulcers    age 68 or 70   Hypertension    STD (sexually transmitted disease) 1996   Hx HSV   Wears glasses     Current Outpatient Medications on File Prior to Visit  Medication Sig Dispense Refill   Cholecalciferol (VITAMIN D  PO) Take by mouth.     Coenzyme Q10 (CO Q 10 PO) Take by mouth.     fluticasone (FLONASE) 50 MCG/ACT nasal spray Place 2 sprays into the nose daily.     hydrocortisone  2.5 % cream Apply topically 2 (two) times daily. 30 g 0   ibuprofen  (ADVIL ) 800 MG tablet Take 1 tablet (800 mg total) by mouth every 8 (eight) hours as needed. 90 tablet 0   Lactic Ac-Citric Ac-Pot Bitart (PHEXXI ) 1.8-1-0.4 % GEL Place 1 Applicatorful vaginally daily. Insert within 1 hour of intercourse. 120 g 1   MAGNESIUM PO Take by mouth.     Multiple Vitamin (MULTI VITAMIN PO) Take by mouth daily.     olmesartan -hydrochlorothiazide (BENICAR  HCT) 40-25 MG tablet Take 1 tablet by mouth daily. 90 tablet 3   Olopatadine  HCl 0.2 % SOLN Apply 1 drop to eye daily. 2.5 mL 2   Omega-3 Fatty Acids (OMEGA 3 PO) Take by mouth.     omeprazole  (PRILOSEC) 20 MG capsule TAKE ONE CAPSULE BY MOUTH ONE TIME DAILY 90 capsule 0  ondansetron  (ZOFRAN -ODT) 4 MG disintegrating tablet Take 1 tablet (4 mg total) by mouth every 8 (eight) hours as needed for nausea or vomiting. 20 tablet 0   b complex vitamins tablet Take 1 tablet by mouth daily.     Cetirizine HCl (ZYRTEC PO) Take by mouth daily.     No current facility-administered medications on file prior to visit.    The following portions of the patient's history were reviewed and updated as appropriate: allergies, current medications, past family history, past medical history, past social history, past surgical history and problem list.  ROS Otherwise as in subjective above   Objective: BP 122/70   Pulse 75   Wt 268 lb 9.6 oz (121.8 kg)   SpO2 99%    BMI 43.03 kg/m   BP Readings from Last 3 Encounters:  05/31/24 122/70  03/24/24 118/62  01/26/24 126/80   Wt Readings from Last 3 Encounters:  05/31/24 268 lb 9.6 oz (121.8 kg)  03/24/24 272 lb (123.4 kg)  01/26/24 282 lb (127.9 kg)    General appearence: alert, no distress, WD/WN,  Heart: RRR, normal S1, S2, no murmurs Lungs: CTA bilaterally, no wheezes, rhonchi, or rales Ext: no edema     Assessment: Encounter Diagnoses  Name Primary?   Diabetes mellitus type 2, diet-controlled (HCC) Yes   Needs flu shot    Essential hypertension, benign    Hyperlipidemia, unspecified hyperlipidemia type    Obesity, Class III, BMI 40-49.9 (morbid obesity) (HCC)    Constipation, unspecified constipation type      Plan: Type 2 diabetes mellitus Well-controlled with Mounjaro  10 mg weekly. A1c is 5.7. Discussed potential side effects of increasing dose to 12.5 mg, including constipation, nausea, indigestion, pancreatitis, and liver test abnormalities. She is aware of the risks and willing to try the higher dose. - Increased Mounjaro  to 12.5 mg weekly at her request. - Monitor for side effects, particularly constipation. - Use Miralax powder if constipation occurs. - Continue healthy eating and exercise.  Essential hypertension Managed with olmesartan  HCT 40/25 mg daily. Reports dizziness and lightheadedness, possibly due to low blood pressure. Blood pressure readings normal but inconsistent medication adherence. Discussed regular monitoring and potential medication adjustment if systolic BP is consistently below 110 mmHg or dizziness persists. - Monitor blood pressure several days a week. - Consider switching to plain olmesartan  if systolic BP is consistently below 110 mmHg or dizziness persists. - Use a pill cutter to adjust dose if necessary.  Obesity, class 3 Managed with Mounjaro , contributing to weight loss from 282 lbs to 268 lbs since July. Increasing Mounjaro  to 12.5 mg expected  to aid further weight loss. Discussed dietary modifications and exercise. - Increased Mounjaro  to 12.5 mg weekly for additional weight loss benefits. - Continue dietary modifications and exercise.  Constipation Noted as a side effect of Mounjaro . Reports occasional constipation without severe symptoms. Discussed dietary fiber intake and Miralax use as needed. - Increase dietary fiber intake to 25 grams per day. - Use Miralax powder if constipation worsens.  Hyperlipidemia Cholesterol levels improved with lifestyle, declines medication.  General Health Maintenance Received flu shot today. Eye examination scheduled for December. Discussed importance of regular monitoring of blood pressure and blood sugar levels. - Ensure eye examination results are sent to the office. - Continue regular monitoring of blood pressure and blood sugar levels.    Tammie Sims was seen today for medical management of chronic issues.  Diagnoses and all orders for this visit:  Diabetes mellitus type 2, diet-controlled (  HCC) -     HgB A1c  Needs flu shot -     Flu vaccine trivalent PF, 6mos and older(Flulaval,Afluria,Fluarix,Fluzone)  Essential hypertension, benign  Hyperlipidemia, unspecified hyperlipidemia type  Obesity, Class III, BMI 40-49.9 (morbid obesity) (HCC)  Constipation, unspecified constipation type  Other orders -     tirzepatide  (MOUNJARO ) 12.5 MG/0.5ML Pen; Inject 12.5 mg into the skin once a week.   Follow up: 4-30mo

## 2024-07-03 ENCOUNTER — Other Ambulatory Visit: Payer: Self-pay | Admitting: Medical

## 2024-07-03 DIAGNOSIS — K219 Gastro-esophageal reflux disease without esophagitis: Secondary | ICD-10-CM

## 2024-08-07 ENCOUNTER — Telehealth: Admitting: Nurse Practitioner

## 2024-08-07 DIAGNOSIS — B9689 Other specified bacterial agents as the cause of diseases classified elsewhere: Secondary | ICD-10-CM

## 2024-08-07 DIAGNOSIS — J019 Acute sinusitis, unspecified: Secondary | ICD-10-CM | POA: Diagnosis not present

## 2024-08-07 MED ORDER — IPRATROPIUM BROMIDE 0.03 % NA SOLN: 2.0000 | Freq: Two times a day (BID) | NASAL | 0 refills | Status: AC

## 2024-08-07 MED ORDER — AMOXICILLIN-POT CLAVULANATE 875-125 MG PO TABS: 1.0000 | ORAL_TABLET | Freq: Two times a day (BID) | ORAL | 0 refills | Status: AC

## 2024-08-07 NOTE — Progress Notes (Signed)
 " Virtual Visit Consent   Tammie Sims, you are scheduled for a virtual visit with a [employer] provider today. Just as with appointments in the office, your consent must be obtained to participate. Your consent will be active for this visit and any virtual visit you may have with one of our providers in the next 365 days. If you have a MyChart account, a copy of this consent can be sent to you electronically.  As this is a virtual visit, video technology does not allow for your provider to perform a traditional examination. This may limit your provider's ability to fully assess your condition. If your provider identifies any concerns that need to be evaluated in person or the need to arrange testing (such as labs, EKG, etc.), we will make arrangements to do so. Although advances in technology are sophisticated, we cannot ensure that it will always work on either your end or our end. If the connection with a video visit is poor, the visit may have to be switched to a telephone visit. With either a video or telephone visit, we are not always able to ensure that we have a secure connection.  By engaging in this virtual visit, you consent to the provision of healthcare and authorize for your insurance to be billed (if applicable) for the services provided during this visit. Depending on your insurance coverage, you may receive a charge related to this service.  I need to obtain your verbal consent now. Are you willing to proceed with your visit today? Tammie Sims has provided verbal consent on 08/07/2024 for a virtual visit (video or telephone). Tammie LELON Servant, NP  Date: 08/07/2024 3:59 PM   Virtual Visit via Video Note   I, Tammie Sims, connected with  Tammie Sims  ([id number], 08-12-1970) on 08/07/24 at  3:45 PM EST by a video-enabled telemedicine application and verified that I am speaking with the correct person using two identifiers.  Location: Patient: Virtual Visit Location  Patient: Home Provider: Virtual Visit Location Provider: Home Office   I discussed the limitations of evaluation and management by telemedicine and the availability of in person appointments. The patient expressed understanding and agreed to proceed.    History of Present Illness: Tammie Sims is a 54 y.o. who identifies as a female who was assigned female at birth, and is being seen today for sinusitis symptoms  Over the past 2 weeks Tammie Sims has been experiencing symptoms of sinus pain (frontal and maxillary), sinus congestion, headache, increased sinus drainage, malaise and facial pain. Taking mucinex with no relief   Problems:  Patient Active Problem List   Diagnosis Date Noted   Abnormal RBC indices 01/26/2024   Vitamin D  deficiency 11/12/2023   Obesity, Class III, BMI 40-49.9 (morbid obesity) (HCC) 04/01/2023   Hyperlipidemia 04/01/2023   Encounter for health maintenance examination in adult 11/20/2022   Diabetes mellitus type 2, diet-controlled (HCC) 11/20/2022   Essential hypertension, benign 11/20/2022   Gastroesophageal reflux disease 11/20/2022    Allergies: Allergies[1] Medications: Current Medications[2]  Observations/Objective: Patient is well-developed, well-nourished in no acute distress.  Resting comfortably at home.  Head is normocephalic, atraumatic.  No labored breathing.  Speech is clear and coherent with logical content.  Patient is alert and oriented at baseline.    Assessment and Plan: 1. Acute bacterial sinusitis (Primary) - ipratropium (ATROVENT ) 0.03 % nasal spray; Place 2 sprays into both nostrils every 12 (twelve) hours.  Dispense: 30 mL; Refill: 0 -  amoxicillin -clavulanate (AUGMENTIN ) 875-125 MG tablet; Take 1 tablet by mouth 2 (two) times daily for 7 days.  Dispense: 14 tablet; Refill: 0  Use NeilMed sinus rinse or Navage at home  Follow Up Instructions: I discussed the assessment and treatment plan with the patient. The patient was  provided an opportunity to ask questions and all were answered. The patient agreed with the plan and demonstrated an understanding of the instructions.  A copy of instructions were sent to the patient via MyChart unless otherwise noted below.    The patient was advised to call back or seek an in-person evaluation if the symptoms worsen or if the condition fails to improve as anticipated.    Tammie LELON Servant, NP     [1]  Allergies Allergen Reactions   Biaxin [Clarithromycin] Nausea And Vomiting  [2]  Current Outpatient Medications:    amoxicillin -clavulanate (AUGMENTIN ) 875-125 MG tablet, Take 1 tablet by mouth 2 (two) times daily for 7 days., Disp: 14 tablet, Rfl: 0   ipratropium (ATROVENT ) 0.03 % nasal spray, Place 2 sprays into both nostrils every 12 (twelve) hours., Disp: 30 mL, Rfl: 0   b complex vitamins tablet, Take 1 tablet by mouth daily., Disp: , Rfl:    Cetirizine HCl (ZYRTEC PO), Take by mouth daily., Disp: , Rfl:    Cholecalciferol (VITAMIN D  PO), Take by mouth., Disp: , Rfl:    Coenzyme Q10 (CO Q 10 PO), Take by mouth., Disp: , Rfl:    fluticasone (FLONASE) 50 MCG/ACT nasal spray, Place 2 sprays into the nose daily., Disp: , Rfl:    hydrocortisone  2.5 % cream, Apply topically 2 (two) times daily., Disp: 30 g, Rfl: 0   ibuprofen  (ADVIL ) 800 MG tablet, Take 1 tablet (800 mg total) by mouth every 8 (eight) hours as needed., Disp: 90 tablet, Rfl: 0   Lactic Ac-Citric Ac-Pot Bitart (PHEXXI ) 1.8-1-0.4 % GEL, Place 1 Applicatorful vaginally daily. Insert within 1 hour of intercourse., Disp: 120 g, Rfl: 1   MAGNESIUM PO, Take by mouth., Disp: , Rfl:    Multiple Vitamin (MULTI VITAMIN PO), Take by mouth daily., Disp: , Rfl:    olmesartan -hydrochlorothiazide (BENICAR  HCT) 40-25 MG tablet, Take 1 tablet by mouth daily., Disp: 90 tablet, Rfl: 3   Olopatadine  HCl 0.2 % SOLN, Apply 1 drop to eye daily., Disp: 2.5 mL, Rfl: 2   Omega-3 Fatty Acids (OMEGA 3 PO), Take by mouth., Disp: , Rfl:     omeprazole  (PRILOSEC) 20 MG capsule, TAKE ONE CAPSULE BY MOUTH ONE TIME DAILY, Disp: 90 capsule, Rfl: 1   ondansetron  (ZOFRAN -ODT) 4 MG disintegrating tablet, Take 1 tablet (4 mg total) by mouth every 8 (eight) hours as needed for nausea or vomiting., Disp: 20 tablet, Rfl: 0   tirzepatide  (MOUNJARO ) 12.5 MG/0.5ML Pen, Inject 12.5 mg into the skin once a week., Disp: 6 mL, Rfl: 2  "

## 2024-08-07 NOTE — Patient Instructions (Addendum)
 " Tammie Sims, thank you for joining Tammie LELON Servant, NP for today's virtual visit.  While this provider is not your primary care provider (PCP), if your PCP is located in our provider database this encounter information will be shared with them immediately following your visit.   A [facility] MyChart account gives you access to today's visit and all your visits, tests, and labs performed at [facility]  click here if you don't have a [facility] MyChart account or go to [email or web address]  Consent: (Patient) Tammie Sims provided verbal consent for this virtual visit at the beginning of the encounter.  Current Medications:  Current Outpatient Medications:    amoxicillin -clavulanate (AUGMENTIN ) 875-125 MG tablet, Take 1 tablet by mouth 2 (two) times daily for 7 days., Disp: 14 tablet, Rfl: 0   ipratropium (ATROVENT ) 0.03 % nasal spray, Place 2 sprays into both nostrils every 12 (twelve) hours., Disp: 30 mL, Rfl: 0   b complex vitamins tablet, Take 1 tablet by mouth daily., Disp: , Rfl:    Cetirizine HCl (ZYRTEC PO), Take by mouth daily., Disp: , Rfl:    Cholecalciferol (VITAMIN D  PO), Take by mouth., Disp: , Rfl:    Coenzyme Q10 (CO Q 10 PO), Take by mouth., Disp: , Rfl:    fluticasone (FLONASE) 50 MCG/ACT nasal spray, Place 2 sprays into the nose daily., Disp: , Rfl:    hydrocortisone  2.5 % cream, Apply topically 2 (two) times daily., Disp: 30 g, Rfl: 0   ibuprofen  (ADVIL ) 800 MG tablet, Take 1 tablet (800 mg total) by mouth every 8 (eight) hours as needed., Disp: 90 tablet, Rfl: 0   Lactic Ac-Citric Ac-Pot Bitart (PHEXXI ) 1.8-1-0.4 % GEL, Place 1 Applicatorful vaginally daily. Insert within 1 hour of intercourse., Disp: 120 g, Rfl: 1   MAGNESIUM PO, Take by mouth., Disp: , Rfl:    Multiple Vitamin (MULTI VITAMIN PO), Take by mouth daily., Disp: , Rfl:    olmesartan -hydrochlorothiazide (BENICAR  HCT) 40-25 MG tablet, Take 1 tablet by mouth daily., Disp: 90 tablet,  Rfl: 3   Olopatadine  HCl 0.2 % SOLN, Apply 1 drop to eye daily., Disp: 2.5 mL, Rfl: 2   Omega-3 Fatty Acids (OMEGA 3 PO), Take by mouth., Disp: , Rfl:    omeprazole  (PRILOSEC) 20 MG capsule, TAKE ONE CAPSULE BY MOUTH ONE TIME DAILY, Disp: 90 capsule, Rfl: 1   ondansetron  (ZOFRAN -ODT) 4 MG disintegrating tablet, Take 1 tablet (4 mg total) by mouth every 8 (eight) hours as needed for nausea or vomiting., Disp: 20 tablet, Rfl: 0   tirzepatide  (MOUNJARO ) 12.5 MG/0.5ML Pen, Inject 12.5 mg into the skin once a week., Disp: 6 mL, Rfl: 2   Medications ordered in this encounter:  Meds ordered this encounter  Medications   ipratropium (ATROVENT ) 0.03 % nasal spray    Sig: Place 2 sprays into both nostrils every 12 (twelve) hours.    Dispense:  30 mL    Refill:  0    Supervising Provider:   LAMPTEY, PHILIP O [[id number]]   amoxicillin -clavulanate (AUGMENTIN ) 875-125 MG tablet    Sig: Take 1 tablet by mouth 2 (two) times daily for 7 days.    Dispense:  14 tablet    Refill:  0    Supervising Provider:   BLAISE ALEENE KIDD [[id number]]     *If you need refills on other medications prior to your next appointment, please contact your pharmacy*  Follow-Up: Call back or seek an in-person evaluation if the symptoms worsen or  if the condition fails to improve as anticipated.  [facility] [phone]  Other Instructions Use NeilMed sinus rinse or Navage at home   If you have been instructed to have an in-person evaluation today at a local Urgent Care facility, please use the link below. It will take you to a list of all of our available [facility], including address, phone number and hours of operation. Please do not delay care.  [facility]  If you or a family member do not have a primary care provider, use the link below to schedule a visit and establish care. When you choose a [facility] primary care physician or advanced practice provider, you gain a long-term  partner in health. Find a Primary Care Provider  Learn more about [facility] in-office and virtual care options:  - Get Care Now  "

## 2024-08-09 ENCOUNTER — Other Ambulatory Visit: Payer: Self-pay | Admitting: Medical

## 2024-08-09 DIAGNOSIS — Z1231 Encounter for screening mammogram for malignant neoplasm of breast: Secondary | ICD-10-CM

## 2024-08-09 NOTE — Telephone Encounter (Signed)
"  On higher dose.  "

## 2024-08-18 ENCOUNTER — Ambulatory Visit: Admission: RE | Admit: 2024-08-18 | Discharge: 2024-08-18 | Disposition: A | Source: Ambulatory Visit

## 2024-08-18 DIAGNOSIS — Z1231 Encounter for screening mammogram for malignant neoplasm of breast: Secondary | ICD-10-CM

## 2024-08-24 ENCOUNTER — Ambulatory Visit: Payer: Self-pay | Admitting: Medical

## 2024-11-30 ENCOUNTER — Encounter: Payer: Self-pay | Admitting: Medical
